# Patient Record
Sex: Female | Born: 1970 | Race: White | Hispanic: No | Marital: Single | Smoking: Former smoker
Health system: Southern US, Community
[De-identification: ages and names within clinical notes are randomized; demographics above are authoritative.]

## PROBLEM LIST (undated history)

## (undated) DIAGNOSIS — Z860100 Personal history of colon polyps, unspecified: Secondary | ICD-10-CM

## (undated) DIAGNOSIS — L409 Psoriasis, unspecified: Secondary | ICD-10-CM

## (undated) DIAGNOSIS — I82409 Acute embolism and thrombosis of unspecified deep veins of unspecified lower extremity: Secondary | ICD-10-CM

## (undated) DIAGNOSIS — F419 Anxiety disorder, unspecified: Secondary | ICD-10-CM

## (undated) DIAGNOSIS — F329 Major depressive disorder, single episode, unspecified: Secondary | ICD-10-CM

## (undated) DIAGNOSIS — R000 Tachycardia, unspecified: Secondary | ICD-10-CM

## (undated) DIAGNOSIS — F32A Depression, unspecified: Secondary | ICD-10-CM

## (undated) HISTORY — DX: Personal history of colonic polyps: Z86.010

## (undated) HISTORY — DX: Acute embolism and thrombosis of unspecified deep veins of unspecified lower extremity: I82.409

## (undated) HISTORY — DX: Anxiety disorder, unspecified: F41.9

## (undated) HISTORY — PX: COLPOSCOPY: SHX161

## (undated) HISTORY — DX: Personal history of colon polyps, unspecified: Z86.0100

## (undated) HISTORY — DX: Psoriasis, unspecified: L40.9

---

## 1998-05-17 ENCOUNTER — Encounter: Admission: RE | Admit: 1998-05-17 | Discharge: 1998-08-15 | Payer: Self-pay | Admitting: Gynecology

## 1998-11-21 ENCOUNTER — Inpatient Hospital Stay (HOSPITAL_COMMUNITY): Admission: AD | Admit: 1998-11-21 | Discharge: 1998-11-24 | Payer: Self-pay | Admitting: Obstetrics and Gynecology

## 1998-11-26 ENCOUNTER — Encounter (HOSPITAL_COMMUNITY): Admission: RE | Admit: 1998-11-26 | Discharge: 1999-02-24 | Payer: Self-pay | Admitting: *Deleted

## 1998-12-26 ENCOUNTER — Other Ambulatory Visit: Admission: RE | Admit: 1998-12-26 | Discharge: 1998-12-26 | Payer: Self-pay | Admitting: Obstetrics and Gynecology

## 2001-09-25 ENCOUNTER — Other Ambulatory Visit: Admission: RE | Admit: 2001-09-25 | Discharge: 2001-09-25 | Payer: Self-pay | Admitting: Gynecology

## 2002-10-13 ENCOUNTER — Other Ambulatory Visit: Admission: RE | Admit: 2002-10-13 | Discharge: 2002-10-13 | Payer: Self-pay | Admitting: Gynecology

## 2004-02-06 ENCOUNTER — Other Ambulatory Visit: Admission: RE | Admit: 2004-02-06 | Discharge: 2004-02-06 | Payer: Self-pay | Admitting: Gynecology

## 2005-02-13 ENCOUNTER — Other Ambulatory Visit: Admission: RE | Admit: 2005-02-13 | Discharge: 2005-02-13 | Payer: Self-pay | Admitting: Gynecology

## 2005-08-26 HISTORY — PX: TUBAL LIGATION: SHX77

## 2006-02-14 ENCOUNTER — Other Ambulatory Visit: Admission: RE | Admit: 2006-02-14 | Discharge: 2006-02-14 | Payer: Self-pay | Admitting: Gynecology

## 2006-08-26 HISTORY — PX: OTHER SURGICAL HISTORY: SHX169

## 2007-02-19 ENCOUNTER — Other Ambulatory Visit: Admission: RE | Admit: 2007-02-19 | Discharge: 2007-02-19 | Payer: Self-pay | Admitting: Gynecology

## 2008-11-24 ENCOUNTER — Other Ambulatory Visit: Admission: RE | Admit: 2008-11-24 | Discharge: 2008-11-24 | Payer: Self-pay | Admitting: Gynecology

## 2008-11-24 ENCOUNTER — Encounter: Payer: Self-pay | Admitting: Women's Health

## 2008-11-24 ENCOUNTER — Ambulatory Visit: Payer: Self-pay | Admitting: Women's Health

## 2009-07-05 ENCOUNTER — Ambulatory Visit: Payer: Self-pay | Admitting: Gynecology

## 2009-07-11 ENCOUNTER — Ambulatory Visit: Payer: Self-pay | Admitting: Gynecology

## 2009-08-26 HISTORY — PX: ANTERIOR CRUCIATE LIGAMENT REPAIR: SHX115

## 2010-04-05 ENCOUNTER — Ambulatory Visit: Payer: Self-pay | Admitting: Women's Health

## 2010-04-05 ENCOUNTER — Other Ambulatory Visit: Admission: RE | Admit: 2010-04-05 | Discharge: 2010-04-05 | Payer: Self-pay | Admitting: Gynecology

## 2010-07-26 ENCOUNTER — Ambulatory Visit: Payer: Self-pay | Admitting: Women's Health

## 2010-09-17 ENCOUNTER — Encounter: Payer: Self-pay | Admitting: Specialist

## 2011-04-13 ENCOUNTER — Other Ambulatory Visit: Payer: Self-pay | Admitting: Women's Health

## 2011-04-16 ENCOUNTER — Other Ambulatory Visit: Payer: Self-pay | Admitting: *Deleted

## 2011-04-16 NOTE — Telephone Encounter (Signed)
Refusal was called in for Alprazolam 0.5 #30 per [facility] [phone]

## 2011-05-27 ENCOUNTER — Other Ambulatory Visit: Payer: Self-pay | Admitting: Women's Health

## 2011-06-10 ENCOUNTER — Telehealth: Payer: Self-pay | Admitting: *Deleted

## 2011-06-10 DIAGNOSIS — F329 Major depressive disorder, single episode, unspecified: Secondary | ICD-10-CM

## 2011-06-10 DIAGNOSIS — F32A Depression, unspecified: Secondary | ICD-10-CM

## 2011-06-10 MED ORDER — FLUOXETINE HCL 20 MG PO CAPS: 20.0000 mg | ORAL_CAPSULE | Freq: Every day | ORAL | Status: DC

## 2011-06-10 NOTE — Telephone Encounter (Signed)
Left message on cell

## 2011-06-10 NOTE — Telephone Encounter (Signed)
Patient requests to speak to you about her meds.  No other information was given.

## 2011-06-10 NOTE — Telephone Encounter (Signed)
Telephone call to review med request. She states she stopped Zoloft several months ago due to cost, and is taking half dose of Wellbutrin. States would like to stop all medications but is still having problems with depression. 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 Both parents and brother are deceased, no family help available. States her daughters are well. Requested a medication that may be less expensive. States cannot afford a psychiatrist at this point, no insurance. Reviewed could try Prozac. Will finish out the remaining Wellbutrin and start on Prozac 20. Will call if problems with the change. Denies any suicidal ideation, but states is struggling with depression. Free Pap clinic discussed will call and make an appointment.

## 2011-07-21 ENCOUNTER — Other Ambulatory Visit: Payer: Self-pay | Admitting: Women's Health

## 2011-07-22 NOTE — Telephone Encounter (Signed)
RX CALLED IN SINCE E-SCRIPTS IS NOT WORKING TODAY.

## 2011-07-31 ENCOUNTER — Other Ambulatory Visit: Payer: Self-pay | Admitting: *Deleted

## 2011-07-31 NOTE — Telephone Encounter (Signed)
rx denied per last refill request. Needs annual exam

## 2011-08-02 ENCOUNTER — Telehealth: Payer: Self-pay | Admitting: *Deleted

## 2011-08-02 NOTE — Telephone Encounter (Signed)
Patient wants to speak directly to you.  She said she wants to talk to you about a medication she is on.  (FYI Prescriptions have been denied due to no annual exam -TF noted as well)

## 2011-08-05 NOTE — Telephone Encounter (Signed)
Telephone call, requesting medication for anxiety and depression. Has not been in the office in greater than one year did review we cannot prescribe until seen in the office. Switched to appointments.

## 2011-08-07 ENCOUNTER — Ambulatory Visit (INDEPENDENT_AMBULATORY_CARE_PROVIDER_SITE_OTHER): Payer: Self-pay | Admitting: Women's Health

## 2011-08-07 ENCOUNTER — Encounter: Payer: Self-pay | Admitting: Women's Health

## 2011-08-07 VITALS — BP 132/80

## 2011-08-07 DIAGNOSIS — F419 Anxiety disorder, unspecified: Secondary | ICD-10-CM

## 2011-08-07 DIAGNOSIS — F411 Generalized anxiety disorder: Secondary | ICD-10-CM

## 2011-08-07 NOTE — Progress Notes (Signed)
Patient ID: Sydney Glover, female   DOB: Sep 11, 1970, 40 y.o.   MRN: [id number] Presents for Prozac refill. Last annual was August of 2011. Reviewed Pap clinic, will have Pap done there and sent to our office. Having a regular 4 to five-day monthly cycle. No other complaints or concerns.  States doing well on Prozac 40 and would like to continue. She is without insurance, has had counseling in the past and declines need at this time. She has had situational problems in the past- divorce, bankruptcy, loss of job, and no child support. States has had long-term anxiety and depression and Prozac does help. Uses an occasional Xanax for rest.   Anxiety/depression   Plan: Prescription for Prozac 40 daily was given. Xanax 0.5 half to one tablet at bedtime when necessary. Reviewed importance of using sparingly, addictive properties reviewed. Encouraged counseling as needed. Reviewed importance of exercise in relationship to anxiety/depression.

## 2011-10-01 ENCOUNTER — Other Ambulatory Visit: Payer: Self-pay | Admitting: *Deleted

## 2011-10-02 MED ORDER — ALPRAZOLAM 0.5 MG PO TABS: 0.5000 mg | ORAL_TABLET | Freq: Every evening | ORAL | Status: DC | PRN

## 2011-10-02 NOTE — Telephone Encounter (Signed)
rx called in

## 2011-11-25 ENCOUNTER — Other Ambulatory Visit: Payer: Self-pay | Admitting: *Deleted

## 2011-11-25 MED ORDER — ALPRAZOLAM 0.5 MG PO TABS: 0.5000 mg | ORAL_TABLET | Freq: Every evening | ORAL | Status: DC | PRN

## 2011-11-25 NOTE — Telephone Encounter (Signed)
Pt saw you in Dec 2012

## 2011-12-23 ENCOUNTER — Other Ambulatory Visit: Payer: Self-pay | Admitting: *Deleted

## 2011-12-23 MED ORDER — ALPRAZOLAM 0.5 MG PO TABS: 0.5000 mg | ORAL_TABLET | Freq: Every evening | ORAL | Status: DC | PRN

## 2011-12-24 NOTE — Telephone Encounter (Signed)
rx called in

## 2012-01-24 ENCOUNTER — Other Ambulatory Visit: Payer: Self-pay | Admitting: *Deleted

## 2012-01-24 MED ORDER — ALPRAZOLAM 0.5 MG PO TABS: 0.5000 mg | ORAL_TABLET | Freq: Every evening | ORAL | Status: DC | PRN

## 2012-02-20 ENCOUNTER — Other Ambulatory Visit: Payer: Self-pay

## 2012-02-20 NOTE — Telephone Encounter (Signed)
Message left on cell to call in regards to prescription request

## 2012-02-21 MED ORDER — ALPRAZOLAM 0.5 MG PO TABS: 0.5000 mg | ORAL_TABLET | Freq: Every evening | ORAL | Status: DC | PRN

## 2012-02-21 NOTE — Telephone Encounter (Signed)
Message left on cell to cell in regards to prescription request.

## 2012-02-21 NOTE — Telephone Encounter (Signed)
Telephone call reviewed addictive properties of xanax, uses at hs for sleep mostly.  Aware of addictive properties will use sparingly.

## 2012-03-19 ENCOUNTER — Other Ambulatory Visit: Payer: Self-pay | Admitting: *Deleted

## 2012-03-19 MED ORDER — ALPRAZOLAM 0.5 MG PO TABS: 0.5000 mg | ORAL_TABLET | Freq: Every evening | ORAL | Status: DC | PRN

## 2012-03-19 NOTE — Telephone Encounter (Signed)
rx called in

## 2012-04-16 ENCOUNTER — Other Ambulatory Visit: Payer: Self-pay | Admitting: *Deleted

## 2012-04-16 NOTE — Telephone Encounter (Signed)
Last fill date 03-19-12

## 2012-04-17 ENCOUNTER — Other Ambulatory Visit: Payer: Self-pay | Admitting: Women's Health

## 2012-04-20 MED ORDER — ALPRAZOLAM 0.5 MG PO TABS: 0.5000 mg | ORAL_TABLET | Freq: Every evening | ORAL | Status: AC | PRN

## 2012-04-20 NOTE — Telephone Encounter (Signed)
Rx phoned into pharmacy.

## 2012-05-19 ENCOUNTER — Other Ambulatory Visit: Payer: Self-pay | Admitting: *Deleted

## 2012-05-20 MED ORDER — ALPRAZOLAM 0.5 MG PO TABS: 0.5000 mg | ORAL_TABLET | Freq: Every evening | ORAL | Status: DC | PRN

## 2012-06-20 ENCOUNTER — Other Ambulatory Visit: Payer: Self-pay | Admitting: Women's Health

## 2012-06-22 NOTE — Telephone Encounter (Signed)
Patient is overdue for yearly exam although she has seen you in the last year.  I will have appt desk attempt to contact her and get her CE scheduled.

## 2012-06-22 NOTE — Telephone Encounter (Signed)
Called into pharmacy

## 2012-08-09 ENCOUNTER — Other Ambulatory Visit: Payer: Self-pay | Admitting: Women's Health

## 2012-08-10 NOTE — Telephone Encounter (Signed)
Please call patient and inform it has been less than one month since last refill, review best not to take daily.

## 2012-08-10 NOTE — Telephone Encounter (Signed)
Called the pharmacy with denial Renette Butters

## 2012-08-14 ENCOUNTER — Other Ambulatory Visit: Payer: Self-pay | Admitting: Women's Health

## 2012-09-16 ENCOUNTER — Other Ambulatory Visit: Payer: Self-pay | Admitting: Women's Health

## 2012-09-17 NOTE — Telephone Encounter (Signed)
rx called in K[facility] 

## 2012-10-16 ENCOUNTER — Other Ambulatory Visit: Payer: Self-pay | Admitting: Women's Health

## 2012-10-16 NOTE — Telephone Encounter (Signed)
Pharmacy notified by phone of denial of refill due to patient needs to call the office and schedule appointment.

## 2012-10-17 ENCOUNTER — Other Ambulatory Visit: Payer: Self-pay | Admitting: Women's Health

## 2012-10-21 ENCOUNTER — Telehealth: Payer: Self-pay | Admitting: *Deleted

## 2012-10-21 DIAGNOSIS — F32A Depression, unspecified: Secondary | ICD-10-CM

## 2012-10-21 MED ORDER — FLUOXETINE HCL 40 MG PO CAPS: 40.0000 mg | ORAL_CAPSULE | Freq: Every day | ORAL | Status: DC

## 2012-10-21 NOTE — Telephone Encounter (Signed)
Pt Rx for Prozac 40 mg because pt is very overdue for annual. Last seen in 08/07/2011. I asked her about making appointment and she has no insurance. She asked if you would call her [phone] to speak with her.

## 2012-10-21 NOTE — Telephone Encounter (Signed)
Telephone call, instructed to make office visit. We will call in the Prozac for one month but cannot continue to call in. Switched to appointments.

## 2012-10-30 ENCOUNTER — Encounter: Payer: Self-pay | Admitting: Women's Health

## 2012-11-06 ENCOUNTER — Encounter: Payer: Self-pay | Admitting: Women's Health

## 2012-11-11 ENCOUNTER — Encounter: Payer: Self-pay | Admitting: Women's Health

## 2012-11-11 ENCOUNTER — Ambulatory Visit (INDEPENDENT_AMBULATORY_CARE_PROVIDER_SITE_OTHER): Payer: Self-pay | Admitting: Women's Health

## 2012-11-11 VITALS — BP 122/74 | Ht 64.0 in | Wt 242.0 lb

## 2012-11-11 DIAGNOSIS — Z01419 Encounter for gynecological examination (general) (routine) without abnormal findings: Secondary | ICD-10-CM

## 2012-11-11 DIAGNOSIS — F341 Dysthymic disorder: Secondary | ICD-10-CM

## 2012-11-11 DIAGNOSIS — Z860100 Personal history of colon polyps, unspecified: Secondary | ICD-10-CM | POA: Insufficient documentation

## 2012-11-11 DIAGNOSIS — F32A Depression, unspecified: Secondary | ICD-10-CM

## 2012-11-11 DIAGNOSIS — F329 Major depressive disorder, single episode, unspecified: Secondary | ICD-10-CM

## 2012-11-11 DIAGNOSIS — F419 Anxiety disorder, unspecified: Secondary | ICD-10-CM | POA: Insufficient documentation

## 2012-11-11 MED ORDER — FLUOXETINE HCL 20 MG PO CAPS: 40.0000 mg | ORAL_CAPSULE | Freq: Every day | ORAL | Status: DC

## 2012-11-11 MED ORDER — ALPRAZOLAM 0.5 MG PO TABS: 0.5000 mg | ORAL_TABLET | Freq: Every evening | ORAL | Status: DC | PRN

## 2012-11-11 NOTE — Progress Notes (Signed)
Sydney Glover May 27, 1971 [id number]    History:    The patient presents for annual exam.  Regular monthly cycle/condoms. History of normal Paps. Benign colon polyp 2011/father died of colon cancer at 48. Long-term history of anxiety with depression, has been on Lexapro, Zoloft, Wellbutrin in the past. Currently on Prozac 40 mg doing well other than complaint of increased fatigue. Uses occasional Xanax 0.5. Requesting minimum, in school and  working part time.   Past medical history, past surgical history, family history and social history were all reviewed and documented in the EPIC chart. Mother died of heart disease age 39. History of  BTL that was reversed 2007, but did not conceive after. Sydney Glover 20, hoping to go to [employer]. Sydney Glover 15 doing well, currently living with her father.   ROS:  A  ROS was performed and pertinent positives and negatives are included in the history.  Exam:  Filed Vitals:   11/11/12 1510  BP: 122/74    General appearance:  Normal Head/Neck:  Normal, without cervical or supraclavicular adenopathy. Thyroid:  Symmetrical, normal in size, without palpable masses or nodularity. Respiratory  Effort:  Normal  Auscultation:  Clear without wheezing or rhonchi Cardiovascular  Auscultation:  Regular rate, without rubs, murmurs or gallops  Edema/varicosities:  Not grossly evident Abdominal  Soft,nontender, without masses, guarding or rebound.  Liver/spleen:  No organomegaly noted  Hernia:  None appreciated  Skin  Inspection:  Grossly normal  Palpation:  Grossly normal Neurologic/psychiatric  Orientation:  Normal with appropriate conversation.  Mood/affect:  Normal  Genitourinary    Breasts: Examined lying and sitting.     Right: Without masses, retractions, discharge or axillary adenopathy.     Left: Without masses, retractions, discharge or axillary adenopathy.   Inguinal/mons:  Normal without inguinal adenopathy  External genitalia:   Normal  BUS/Urethra/Skene's glands:  Normal  Bladder:  Normal  Vagina:  Normal  Cervix:  Normal  Uterus:  normal in size, shape and contour.  Midline and mobile  Adnexa/parametria:     Rt: Without masses or tenderness.   Lt: Without masses or tenderness.  Anus and perineum: Normal  Digital rectal exam: Normal sphincter tone without palpated masses or tenderness  Assessment/Plan:  42 y.o. DWF G4P2  for annual exam.     Normal GYN exam Obesity Anxiety with depression  Plan: Discussion about importance of increasing regular exercise, decreasing calories for weight loss, increasing leisure activities. Currently on Prozac 40 mg with increased fatigue with good symptom relief, will try to decrease to 20 mg daily will taper down. Uses occasional Xanax 0.5, prescription, proper use, addictive properties reviewed. SBE's, annual mammogram at 40, calcium rich diet, vitamin D 1000 daily encouraged. Pap normal 03/2010, history of all normal Paps, will come to the Pap clinic for Pap. New Pap screening guidelines reviewed. Condoms with intercourse. Repeat colonoscopy next year.    Harrington Challenger [facility], 4:06 PM 11/11/2012'

## 2012-11-11 NOTE — Patient Instructions (Addendum)

## 2012-11-12 ENCOUNTER — Telehealth: Payer: Self-pay | Admitting: Women's Health

## 2012-11-12 ENCOUNTER — Encounter: Payer: Self-pay | Admitting: Gynecology

## 2012-11-12 NOTE — Telephone Encounter (Signed)
Left message on cell concerning Pap clinic dates: Monday, March 24 at [facility], or  April 14 in  [place], instructed to call Wynona Canes at the cancer center at [phone]

## 2013-03-12 ENCOUNTER — Other Ambulatory Visit: Payer: Self-pay | Admitting: Women's Health

## 2013-03-15 NOTE — Telephone Encounter (Signed)
Called into pharmacy

## 2013-06-24 ENCOUNTER — Other Ambulatory Visit: Payer: Self-pay | Admitting: Women's Health

## 2013-06-25 NOTE — Telephone Encounter (Signed)
Rx called in K[facility] 

## 2013-08-30 ENCOUNTER — Other Ambulatory Visit: Payer: Self-pay | Admitting: Women's Health

## 2013-08-31 NOTE — Telephone Encounter (Signed)
Rx called in 

## 2013-11-17 ENCOUNTER — Other Ambulatory Visit: Payer: Self-pay | Admitting: Women's Health

## 2013-11-22 ENCOUNTER — Other Ambulatory Visit: Payer: Self-pay

## 2013-11-22 MED ORDER — ALPRAZOLAM 0.5 MG PO TABS: ORAL_TABLET | ORAL | Status: DC

## 2013-11-22 NOTE — Telephone Encounter (Signed)
Last CE 11/11/12. Overdue.

## 2013-11-22 NOTE — Telephone Encounter (Signed)
Called into pharmacy

## 2013-12-20 ENCOUNTER — Other Ambulatory Visit: Payer: Self-pay

## 2013-12-20 DIAGNOSIS — F329 Major depressive disorder, single episode, unspecified: Secondary | ICD-10-CM

## 2013-12-20 DIAGNOSIS — F32A Depression, unspecified: Secondary | ICD-10-CM

## 2013-12-20 MED ORDER — FLUOXETINE HCL 20 MG PO CAPS: 40.0000 mg | ORAL_CAPSULE | Freq: Every day | ORAL | Status: DC

## 2014-01-13 ENCOUNTER — Emergency Department (HOSPITAL_BASED_OUTPATIENT_CLINIC_OR_DEPARTMENT_OTHER): Payer: Self-pay

## 2014-01-13 ENCOUNTER — Emergency Department (HOSPITAL_BASED_OUTPATIENT_CLINIC_OR_DEPARTMENT_OTHER)
Admission: EM | Admit: 2014-01-13 | Discharge: 2014-01-13 | Disposition: A | Discharge status: Home / Self Care | Payer: Self-pay | Attending: Emergency Medicine | Admitting: Emergency Medicine

## 2014-01-13 ENCOUNTER — Ambulatory Visit (HOSPITAL_BASED_OUTPATIENT_CLINIC_OR_DEPARTMENT_OTHER): Admission: RE | Admit: 2014-01-13 | Payer: Self-pay | Source: Ambulatory Visit

## 2014-01-13 ENCOUNTER — Encounter (HOSPITAL_BASED_OUTPATIENT_CLINIC_OR_DEPARTMENT_OTHER): Payer: Self-pay | Admitting: Emergency Medicine

## 2014-01-13 ENCOUNTER — Telehealth (HOSPITAL_BASED_OUTPATIENT_CLINIC_OR_DEPARTMENT_OTHER): Payer: Self-pay | Admitting: Emergency Medicine

## 2014-01-13 DIAGNOSIS — J189 Pneumonia, unspecified organism: Secondary | ICD-10-CM

## 2014-01-13 DIAGNOSIS — Z3202 Encounter for pregnancy test, result negative: Secondary | ICD-10-CM | POA: Insufficient documentation

## 2014-01-13 DIAGNOSIS — Z87891 Personal history of nicotine dependence: Secondary | ICD-10-CM | POA: Insufficient documentation

## 2014-01-13 DIAGNOSIS — L559 Sunburn, unspecified: Secondary | ICD-10-CM | POA: Insufficient documentation

## 2014-01-13 DIAGNOSIS — F411 Generalized anxiety disorder: Secondary | ICD-10-CM | POA: Insufficient documentation

## 2014-01-13 DIAGNOSIS — Z872 Personal history of diseases of the skin and subcutaneous tissue: Secondary | ICD-10-CM | POA: Insufficient documentation

## 2014-01-13 DIAGNOSIS — Z79899 Other long term (current) drug therapy: Secondary | ICD-10-CM | POA: Insufficient documentation

## 2014-01-13 DIAGNOSIS — Z860100 Personal history of colon polyps, unspecified: Secondary | ICD-10-CM | POA: Insufficient documentation

## 2014-01-13 DIAGNOSIS — J159 Unspecified bacterial pneumonia: Secondary | ICD-10-CM | POA: Insufficient documentation

## 2014-01-13 LAB — CBC WITH DIFFERENTIAL/PLATELET
Basophils Absolute: 0 10*3/uL (ref 0.0–0.1)
Basophils Relative: 0 % (ref 0–1)
Eosinophils Absolute: 0.1 10*3/uL (ref 0.0–0.7)
Eosinophils Relative: 1 % (ref 0–5)
HCT: 34 % — ABNORMAL LOW (ref 36.0–46.0)
Hemoglobin: 11 g/dL — ABNORMAL LOW (ref 12.0–15.0)
Lymphocytes Relative: 27 % (ref 12–46)
Lymphs Abs: 2 10*3/uL (ref 0.7–4.0)
MCH: 30.1 pg (ref 26.0–34.0)
MCHC: 32.4 g/dL (ref 30.0–36.0)
MCV: 93.2 fL (ref 78.0–100.0)
Monocytes Absolute: 0.3 10*3/uL (ref 0.1–1.0)
Monocytes Relative: 4 % (ref 3–12)
Neutro Abs: 5.1 10*3/uL (ref 1.7–7.7)
Neutrophils Relative %: 69 % (ref 43–77)
Platelets: 280 10*3/uL (ref 150–400)
RBC: 3.65 MIL/uL — ABNORMAL LOW (ref 3.87–5.11)
RDW: 13.6 % (ref 11.5–15.5)
WBC: 7.4 10*3/uL (ref 4.0–10.5)

## 2014-01-13 LAB — URINE MICROSCOPIC-ADD ON

## 2014-01-13 LAB — URINALYSIS, ROUTINE W REFLEX MICROSCOPIC
Bilirubin Urine: NEGATIVE
Glucose, UA: NEGATIVE mg/dL
Ketones, ur: NEGATIVE mg/dL
Leukocytes, UA: NEGATIVE
Nitrite: NEGATIVE
Protein, ur: NEGATIVE mg/dL
Specific Gravity, Urine: 1.012 (ref 1.005–1.030)
Urobilinogen, UA: 1 mg/dL (ref 0.0–1.0)
pH: 7 (ref 5.0–8.0)

## 2014-01-13 LAB — PREGNANCY, URINE: Preg Test, Ur: NEGATIVE

## 2014-01-13 LAB — BASIC METABOLIC PANEL
BUN: 12 mg/dL (ref 6–23)
CO2: 25 mEq/L (ref 19–32)
Calcium: 9.4 mg/dL (ref 8.4–10.5)
Chloride: 102 mEq/L (ref 96–112)
Creatinine, Ser: 0.8 mg/dL (ref 0.50–1.10)
GFR calc Af Amer: >90 mL/min (ref >90)
GFR calc non Af Amer: >90 mL/min (ref >90)
Glucose, Bld: 147 mg/dL — ABNORMAL HIGH (ref 70–99)
Potassium: 3.7 mEq/L (ref 3.7–5.3)
Sodium: 142 mEq/L (ref 137–147)

## 2014-01-13 LAB — TROPONIN I: Troponin I: <0.3 ng/mL (ref <0.30)

## 2014-01-13 LAB — PRO B NATRIURETIC PEPTIDE: Pro B Natriuretic peptide (BNP): 217.1 pg/mL — ABNORMAL HIGH (ref 0–125)

## 2014-01-13 LAB — CK: Total CK: 66 U/L (ref 7–177)

## 2014-01-13 MED ORDER — AZITHROMYCIN 250 MG PO TABS: 250.0000 mg | ORAL_TABLET | Freq: Every day | ORAL | Status: DC

## 2014-01-13 MED ORDER — HYDROCODONE-ACETAMINOPHEN 5-325 MG PO TABS: 1.0000 | ORAL_TABLET | ORAL | Status: DC | PRN

## 2014-01-13 MED ORDER — ALBUTEROL SULFATE HFA 108 (90 BASE) MCG/ACT IN AERS: 2.0000 | INHALATION_SPRAY | RESPIRATORY_TRACT | Status: DC | PRN

## 2014-01-13 MED ORDER — OXYCODONE-ACETAMINOPHEN 5-325 MG PO TABS
2.0000 | ORAL_TABLET | Freq: Once | ORAL | Status: AC
Start: 2014-01-13 — End: 2014-01-13
  Administered 2014-01-13: 2 via ORAL
  Filled 2014-01-13: qty 2

## 2014-01-13 MED ORDER — IBUPROFEN 800 MG PO TABS: 800.0000 mg | ORAL_TABLET | Freq: Three times a day (TID) | ORAL | Status: DC | PRN

## 2014-01-13 NOTE — ED Provider Notes (Signed)
TIME SEEN: 6:18 PM  CHIEF COMPLAINT: Bilateral lower extremity pain and swelling, shortness of breath  HPI: Patient is a 43 year old female with history of anxiety presents emergency department with complaints of lower extremity swelling that has been present take prednisone for back pain 2 weeks ago. She states that over the weekend, 4 days ago, she was sunburned. Since that time she has noticed increased swelling of bilateral lower extremities and pain. She has had some intermittent nausea and chills but this has improved. She denies that she is having chest pain or chest discomfort but has had 2 days of shortness of breath. She denies any fevers, vomiting or diarrhea. No history of cardiac disease, CHF, PE or DVT. No family history of premature CAD. She denies a history of hypertension, diabetes or hyperlipidemia. She was a former smoker.  ROS: See HPI Constitutional: no fever  Eyes: no drainage  ENT: no runny nose   Cardiovascular:  no chest pain  Resp:  SOB  GI: no vomiting GU: no dysuria Integumentary: no rash  Allergy: no hives  Musculoskeletal: no leg swelling  Neurological: no slurred speech ROS otherwise negative  PAST MEDICAL HISTORY/PAST SURGICAL HISTORY:  Past Medical History  Diagnosis Date  . Anxiety   . Psoriasis   . History of colonic polyps     BENIGN    MEDICATIONS:  Prior to Admission medications   Medication Sig Start Date End Date Taking? Authorizing Provider  ALPRAZolam Duanne Moron) 0.5 MG tablet TAKE 1 TABLET BY MOUTH AT BEDTIME AS NEEDED 11/22/13   Huel Cote, NP  FLUoxetine (PROZAC) 20 MG capsule Take 2 capsules (40 mg total) by mouth daily. 12/20/13   Huel Cote, NP    ALLERGIES:  No Known Allergies  SOCIAL HISTORY:  History  Substance Use Topics  . Smoking status: Former Research scientist (life sciences)  . Smokeless tobacco: Former Systems developer    Quit date: 11/12/2010  . Alcohol Use: Yes     Comment: not much    FAMILY HISTORY: Family History  Problem Relation Age of  Onset  . Hypertension Mother   . Cancer Father 54    COLON  . Diabetes Brother   . Heart attack Brother     1/2 brother-maternal    EXAM: BP 192/103  Pulse 94  Temp(Src) 98.2 F (36.8 C) (Oral)  Resp 16  Ht 5\' 4"  (1.626 m)  Wt 271 lb 12.8 oz (123.288 kg)  BMI 46.63 kg/m2  SpO2 100% CONSTITUTIONAL: Alert and oriented and responds appropriately to questions. Well-appearing; well-nourished, patient is in no apparent distress but appears anxious. She is morbidly obese HEAD: Normocephalic EYES: Conjunctivae clear, PERRL ENT: normal nose; no rhinorrhea; moist mucous membranes; pharynx without lesions noted NECK: Supple, no meningismus, no LAD  CARD: RRR; S1 and S2 appreciated; no murmurs, no clicks, no rubs, no gallops RESP: Normal chest excursion without splinting or tachypnea; breath sounds clear and equal bilaterally; no wheezes, no rhonchi, no rales, no hypoxia or respiratory distress ABD/GI: Normal bowel sounds; non-distended; soft, non-tender, no rebound, no guarding BACK:  The back appears normal and is non-tender to palpation, there is no CVA tenderness EXT: Normal ROM in all joints; non-tender to palpation; no edema; normal capillary refill; no cyanosis, 2+ radial and DP pulses bilaterally    SKIN: Normal color for age and race; warm, patient has a very mild first degree burn to bilateral shins, no blisters NEURO: Moves all extremities equally PSYCH: The patient's mood and manner are appropriate. Grooming and personal  hygiene are appropriate.  MEDICAL DECISION MAKING: Patient here with lower extremity swelling and pain after a sunburn and shortness of breath. Given her obesity, it is difficult for me to tell if there is any actual swelling of her extremities. She has no pitting edema. She has no signs of volume overload. No signs of cellulitis. She has no calf tenderness. She has good perfusion and equal pulses. I am not concerned this time that she has a DVT or arterial  obstruction. No sign of infection. We'll obtain basic labs, BMP and troponin as well as chest x-ray given her complaints of shortness of breath as well. Suspect her feelings of pain and swelling of her lower extremities are due to her first degree burn from her sunburn which is her improving. Given her swelling is bilateral, doubt DVT. She has no risk factors for PE or DVT other than obesity. She denies any chest pain. She is hemodynamically stable. Suspect her hypertension is secondary to anxiety and discomfort. Will treat her with pain medication and reassess. Anticipate that her workup today will likely be unremarkable and she can be discharged home with close outpatient followup.  ED PROGRESS: Patient's labs are unremarkable. Her BNP is 217. Her CK is normal. Her troponin is negative. I do not feel she needs serial sets of enzymes given she states she's had constant shortness of breath for several days. She states she has had a productive cough and some intermittent wheezing other her lungs are clear to auscultation currently. She has no hypoxia or respiratory distress. No leukocytosis or fever but given her complaints of chills, cough and wheezing and an x-ray that shows a possible right lower lobe pneumonia, will treat with azithromycin and albuterol. Have discussed with patient that I feel that her lower extreme swelling and pain is likely secondary to her sunburn is very mild on exam. Will discharge home with prescription for pain medicine but have discussed with patient that she should elevate her legs and use compression stockings. Patient verbalizes understanding and is comfortable with this plan.     EKG Interpretation  Date/Time:  Thursday Jan 13 2014 19:02:07 EDT Ventricular Rate:  96 PR Interval:  152 QRS Duration: 102 QT Interval:  356 QTC Calculation: 449 R Axis:   57 Text Interpretation:  Normal sinus rhythm Incomplete right bundle branch block Borderline ECG Confirmed by Jatoria Kneeland,  DO,  Jenilyn Magana ([id number]) on 01/13/2014 7:07:45 PM          [place], DO 01/13/14 2023

## 2014-01-13 NOTE — Discharge Instructions (Signed)
Pneumonia, Adult °Pneumonia is an infection of the lungs.  °CAUSES °Pneumonia may be caused by bacteria or a virus. Usually, these infections are caused by breathing infectious particles into the lungs (respiratory tract). °SYMPTOMS  °· Cough. °· Fever. °· Chest pain. °· Increased rate of breathing. °· Wheezing. °· Mucus production. °DIAGNOSIS  °If you have the common symptoms of pneumonia, your caregiver will typically confirm the diagnosis with a chest X-ray. The X-ray will show an abnormality in the lung (pulmonary infiltrate) if you have pneumonia. Other tests of your blood, urine, or sputum may be done to find the specific cause of your pneumonia. Your caregiver may also do tests (blood gases or pulse oximetry) to see how well your lungs are working. °TREATMENT  °Some forms of pneumonia may be spread to other people when you cough or sneeze. You may be asked to wear a mask before and during your exam. Pneumonia that is caused by bacteria is treated with antibiotic medicine. Pneumonia that is caused by the influenza virus may be treated with an antiviral medicine. Most other viral infections must run their course. These infections will not respond to antibiotics.  °PREVENTION °A pneumococcal shot (vaccine) is available to prevent a common bacterial cause of pneumonia. This is usually suggested for: °· People over 65 years old. °· Patients on chemotherapy. °· People with chronic lung problems, such as bronchitis or emphysema. °· People with immune system problems. °If you are over 65 or have a high risk condition, you may receive the pneumococcal vaccine if you have not received it before. In some countries, a routine influenza vaccine is also recommended. This vaccine can help prevent some cases of pneumonia. You may be offered the influenza vaccine as part of your care. °If you smoke, it is time to quit. You may receive instructions on how to stop smoking. Your caregiver can provide medicines and counseling to  help you quit. °HOME CARE INSTRUCTIONS  °· Cough suppressants may be used if you are losing too much rest. However, coughing protects you by clearing your lungs. You should avoid using cough suppressants if you can. °· Your caregiver may have prescribed medicine if he or she thinks your pneumonia is caused by a bacteria or influenza. Finish your medicine even if you start to feel better. °· Your caregiver may also prescribe an expectorant. This loosens the mucus to be coughed up. °· Only take over-the-counter or prescription medicines for pain, discomfort, or fever as directed by your caregiver. °· Do not smoke. Smoking is a common cause of bronchitis and can contribute to pneumonia. If you are a smoker and continue to smoke, your cough may last several weeks after your pneumonia has cleared. °· A cold steam vaporizer or humidifier in your room or home may help loosen mucus. °· Coughing is often worse at night. Sleeping in a semi-upright position in a recliner or using a couple pillows under your head will help with this. °· Get rest as you feel it is needed. Your body will usually let you know when you need to rest. °SEEK IMMEDIATE MEDICAL CARE IF:  °· Your illness becomes worse. This is especially true if you are elderly or weakened from any other disease. °· You cannot control your cough with suppressants and are losing sleep. °· You begin coughing up blood. °· You develop pain which is getting worse or is uncontrolled with medicines. °· You have a fever. °· Any of the symptoms which initially brought you in for treatment   are getting worse rather than better.  You develop shortness of breath or chest pain. MAKE SURE YOU:   Understand these instructions.  Will watch your condition.  Will get help right away if you are not doing well or get worse. Document Released: 08/12/2005 Document Revised: 11/04/2011 Document Reviewed: 11/01/2010 [facility] Patient Information 2014 [place], [place]. Sunburn Sunburn  is damage to the skin caused by overexposure to ultraviolet (UV) rays. People with light skin or a fair complexion may be more susceptible to sunburn. Repeated sun exposure causes early skin aging such as wrinkles and sun spots. It also increases the risk of skin cancer. CAUSES A sunburn is caused by getting too much UV radiation from the sun. SYMPTOMS  Red or pink skin.  Soreness and swelling.  Pain.  Blisters.  Peeling skin.  Headache, fever, and fatigue if sunburn covers a large area. TREATMENT  Your caregiver may tell you to take certain medicines to lessen inflammation.  Your caregiver may have you use hydrocortisone cream or spray to help with itching and inflammation.  Your caregiver may prescribe an antibiotic cream to use on blisters. HOME CARE INSTRUCTIONS   Avoid further exposure to the sun.  Cool baths and cool compresses may be helpful if used several times per day. Do not apply ice, since this may result in more damage to the skin.  Only take over-the-counter or prescription medicines for pain, discomfort, or fever as directed by your caregiver.  Use aloe or other over-the-counter sunburn creams or gels on your skin. Do not apply these creams or gels on blisters.  Drink enough fluids to keep your urine clear or pale yellow.  Do not break blisters. If blisters break, your caregiver may recommend an antibiotic cream to apply to the affected area. PREVENTION   Try to avoid the sun between 10:00 a.m. and 4:00 p.m. when it is the strongest.  Apply sunscreen at least 30 minutes before exposure to the sun.  Always wear protective hats, clothing, and sunglasses with UV protection.  Avoid medicines, herbs, and foods that increase your sensitivity to sunlight.  Avoid tanning beds. SEEK IMMEDIATE MEDICAL CARE IF:   You have a fever.  Your pain is uncontrolled with medicine.  You start to vomit or have diarrhea.  You feel faint or develop a headache with  confusion.  You develop severe blistering.  You have a pus-like (purulent) discharge coming from the blisters.  Your burn becomes more painful and swollen. MAKE SURE YOU:  Understand these instructions.  Will watch your condition.  Will get help right away if you are not doing well or get worse. Document Released: 05/22/2005 Document Revised: 12/07/2012 Document Reviewed: 02/03/2011 [facility] Patient Information 2014 [place], [place].    Please keep your leg elevated at all times and you may use compression stockings to help with your swelling.

## 2014-01-13 NOTE — ED Notes (Signed)
Swelling in her legs and abdominal bloating x 2 weeks. Worse after getting sunburn on Sunday.

## 2014-02-09 ENCOUNTER — Encounter: Payer: Self-pay | Admitting: Women's Health

## 2014-02-14 ENCOUNTER — Telehealth: Payer: Self-pay | Admitting: *Deleted

## 2014-02-14 DIAGNOSIS — F32A Depression, unspecified: Secondary | ICD-10-CM

## 2014-02-14 DIAGNOSIS — F329 Major depressive disorder, single episode, unspecified: Secondary | ICD-10-CM

## 2014-02-14 MED ORDER — FLUOXETINE HCL 20 MG PO CAPS: 40.0000 mg | ORAL_CAPSULE | Freq: Every day | ORAL | Status: DC

## 2014-02-14 NOTE — Telephone Encounter (Signed)
Yes okay for xanax .5 every 8 hrs prn   #30 with no refills

## 2014-02-14 NOTE — Telephone Encounter (Signed)
Pt called requesting refill on Prozac 20 mg and xanax 0.5 mg. I explained to patient nancy out of the office until next Tuesday (02/22/14) I can send Prozac 20 mg and nancy will have to approve xanax 0.5 mg.  Pt has annual scheduled on 03/03/14 pt just got health insurance. Okay to refill xanax 0.5 mg?

## 2014-02-15 MED ORDER — ALPRAZOLAM 0.5 MG PO TABS: ORAL_TABLET | ORAL | Status: DC

## 2014-02-15 NOTE — Telephone Encounter (Signed)
rx called in, left on pt voicemail this has been done.

## 2014-03-03 ENCOUNTER — Encounter: Payer: Self-pay | Admitting: Women's Health

## 2014-03-03 ENCOUNTER — Ambulatory Visit (INDEPENDENT_AMBULATORY_CARE_PROVIDER_SITE_OTHER): Payer: Managed Care, Other (non HMO) | Admitting: Women's Health

## 2014-03-03 VITALS — BP 128/86 | Ht 64.0 in | Wt 272.8 lb

## 2014-03-03 DIAGNOSIS — Z833 Family history of diabetes mellitus: Secondary | ICD-10-CM

## 2014-03-03 DIAGNOSIS — Z113 Encounter for screening for infections with a predominantly sexual mode of transmission: Secondary | ICD-10-CM

## 2014-03-03 DIAGNOSIS — N760 Acute vaginitis: Secondary | ICD-10-CM

## 2014-03-03 DIAGNOSIS — A499 Bacterial infection, unspecified: Secondary | ICD-10-CM

## 2014-03-03 DIAGNOSIS — F329 Major depressive disorder, single episode, unspecified: Secondary | ICD-10-CM

## 2014-03-03 DIAGNOSIS — F3289 Other specified depressive episodes: Secondary | ICD-10-CM

## 2014-03-03 DIAGNOSIS — F32A Depression, unspecified: Secondary | ICD-10-CM

## 2014-03-03 DIAGNOSIS — B9689 Other specified bacterial agents as the cause of diseases classified elsewhere: Secondary | ICD-10-CM

## 2014-03-03 DIAGNOSIS — Z01419 Encounter for gynecological examination (general) (routine) without abnormal findings: Secondary | ICD-10-CM

## 2014-03-03 LAB — CBC WITH DIFFERENTIAL/PLATELET
Basophils Absolute: 0 10*3/uL (ref 0.0–0.1)
Basophils Relative: 0 % (ref 0–1)
Eosinophils Absolute: 0.1 10*3/uL (ref 0.0–0.7)
Eosinophils Relative: 1 % (ref 0–5)
HCT: 34.7 % — ABNORMAL LOW (ref 36.0–46.0)
Hemoglobin: 11.5 g/dL — ABNORMAL LOW (ref 12.0–15.0)
Lymphocytes Relative: 30 % (ref 12–46)
Lymphs Abs: 1.8 10*3/uL (ref 0.7–4.0)
MCH: 28.9 pg (ref 26.0–34.0)
MCHC: 33.1 g/dL (ref 30.0–36.0)
MCV: 87.2 fL (ref 78.0–100.0)
Monocytes Absolute: 0.2 10*3/uL (ref 0.1–1.0)
Monocytes Relative: 3 % (ref 3–12)
Neutro Abs: 4 10*3/uL (ref 1.7–7.7)
Neutrophils Relative %: 66 % (ref 43–77)
Platelets: 278 10*3/uL (ref 150–400)
RBC: 3.98 MIL/uL (ref 3.87–5.11)
RDW: 14.6 % (ref 11.5–15.5)
WBC: 6 10*3/uL (ref 4.0–10.5)

## 2014-03-03 LAB — HEMOGLOBIN A1C
Hgb A1c MFr Bld: 5.2 % (ref <5.7)
Mean Plasma Glucose: 103 mg/dL (ref <117)

## 2014-03-03 LAB — WET PREP FOR TRICH, YEAST, CLUE
Trich, Wet Prep: NONE SEEN
Yeast Wet Prep HPF POC: NONE SEEN

## 2014-03-03 MED ORDER — METRONIDAZOLE 500 MG PO TABS: 500.0000 mg | ORAL_TABLET | Freq: Two times a day (BID) | ORAL | Status: DC

## 2014-03-03 MED ORDER — FLUOXETINE HCL 20 MG PO CAPS: 40.0000 mg | ORAL_CAPSULE | Freq: Every day | ORAL | Status: DC

## 2014-03-03 NOTE — Patient Instructions (Signed)

## 2014-03-03 NOTE — Progress Notes (Signed)
Sydney Glover 1970/09/15 [id number]    History:    Presents for annual exam.  Couplet days heavy using no contraception. History of a BTL with reversal 2007 but was unable to conceive after. Normal Pap history has not had any mammogram. History of anxiety and depression has been on Wellbutrin, Lexapro, Zoloft in the past currently on Prozac 40 mg daily and doing well. 2011 benign colon polyp, father history of colon cancer. Had severe sunburn and pneumonia  01/13/2014,  blood sugar 147.  Past medical history, past surgical history, family history and social history were all reviewed and documented in the EPIC chart. Working in Insurance underwriter in office. Mother died age 64 from heart related problems, father died of colon cancer. [place] 22 making poor choices, Sydney Glover 16 doing well.  ROS:  A  12 point ROS was performed and pertinent positives and negatives are included.  Exam:  Filed Vitals:   03/03/14 0832  BP: 128/86    General appearance:  Normal Thyroid:  Symmetrical, normal in size, without palpable masses or nodularity. Respiratory  Auscultation:  Clear without wheezing or rhonchi Cardiovascular  Auscultation:  Regular rate, without rubs, murmurs or gallops  Edema/varicosities:  Not grossly evident Abdominal  Soft,nontender, without masses, guarding or rebound.  Liver/spleen:  No organomegaly noted  Hernia:  None appreciated  Skin  Inspection:  Grossly normal   Breasts: Examined lying and sitting.     Right: Without masses, retractions, discharge or axillary adenopathy.     Left: Without masses, retractions, discharge or axillary adenopathy. Gentitourinary   Inguinal/mons:  Normal without inguinal adenopathy  External genitalia:  Normal  BUS/Urethra/Skene's glands:  Normal  Vagina:  Normal  Cervix:  Normal  Uterus:   normal in size, shape and contour.  Midline and mobile  Adnexa/parametria:     Rt: Without masses or tenderness.   Lt: Without masses or tenderness.  Anus  and perineum: Normal  Digital rectal exam: Normal sphincter tone without palpated masses or tenderness  Assessment/Plan:  43 y.o.DWF G3P2  for annual exam with no complaints.    Morbid obesity  Glucose- 147   12/2013 Anxiety/depression stable on Prozac 40 2011 benign colon polyp Monthly cycle/BTL reversal 2007 with infertility STD screen BV  Plan: Prozac 40 mg prescription, proper use given and reviewed, denies need for counseling at this time. Reviewed importance of followup colonoscopy, instructed to schedule. Condoms encouraged until permanent partner. SBE's, schedule screening annual mammogram. Long discussion on importance of weight loss for health, decreasing calories, increasing exercise. CBC, hemoglobin A1c, UA, HIV, hep B, C., RPR, GC/Chlamydia, half-normal 2014, new screening guidelines reviewed. Flagyl 500 twice daily for 7 days #14, alcohol precautions reviewed. Instructed to call if no relief of discharge. Instructed to check blood pressure away from office, diastolic 86 today, reviewed if continues greater than 130/80 followup with primary care for possible medication.   Note: This dictation was prepared with Dragon/digital dictation.  Any transcriptional errors that result are unintentional. Sydney Glover [facility], 9:03 AM 41/04/2014

## 2014-03-04 LAB — URINALYSIS W MICROSCOPIC + REFLEX CULTURE
Bilirubin Urine: NEGATIVE
Casts: NONE SEEN
Crystals: NONE SEEN
Glucose, UA: NEGATIVE mg/dL
Hgb urine dipstick: NEGATIVE
Ketones, ur: NEGATIVE mg/dL
Leukocytes, UA: NEGATIVE
Nitrite: NEGATIVE
Protein, ur: NEGATIVE mg/dL
Specific Gravity, Urine: 1.02 (ref 1.005–1.030)
Urobilinogen, UA: 0.2 mg/dL (ref 0.0–1.0)
pH: 7 (ref 5.0–8.0)

## 2014-03-04 LAB — HEPATITIS B SURFACE ANTIGEN: Hepatitis B Surface Ag: NEGATIVE

## 2014-03-04 LAB — HIV ANTIBODY (ROUTINE TESTING W REFLEX): HIV 1&2 Ab, 4th Generation: NONREACTIVE

## 2014-03-04 LAB — GC/CHLAMYDIA PROBE AMP
CT Probe RNA: NEGATIVE
GC Probe RNA: NEGATIVE

## 2014-03-04 LAB — HEPATITIS C ANTIBODY: HCV Ab: NEGATIVE

## 2014-03-04 LAB — RPR

## 2014-03-05 LAB — URINE CULTURE
Colony Count: NO GROWTH
Organism ID, Bacteria: NO GROWTH

## 2014-03-07 ENCOUNTER — Encounter: Payer: Self-pay | Admitting: Women's Health

## 2014-03-14 ENCOUNTER — Telehealth: Payer: Self-pay

## 2014-03-14 ENCOUNTER — Other Ambulatory Visit: Payer: Self-pay

## 2014-03-14 ENCOUNTER — Other Ambulatory Visit: Payer: Self-pay | Admitting: Women's Health

## 2014-03-14 MED ORDER — ALPRAZOLAM 0.5 MG PO TABS: ORAL_TABLET | ORAL | Status: DC

## 2014-03-14 MED ORDER — IBUPROFEN 800 MG PO TABS: 800.0000 mg | ORAL_TABLET | Freq: Three times a day (TID) | ORAL | Status: DC | PRN

## 2014-03-14 NOTE — Telephone Encounter (Signed)
Ok to refill 

## 2014-03-14 NOTE — Telephone Encounter (Signed)
Rx sent 

## 2014-03-14 NOTE — Telephone Encounter (Signed)
Please call and review should not take daily, addictive, use sparingly, ok to refill,

## 2014-03-14 NOTE — Telephone Encounter (Signed)
Called into pharmacy

## 2014-03-14 NOTE — Telephone Encounter (Signed)
Pharmacy faxed note requesting new Rx for Ibuprofen 800mg  tab #30 S: one tab q 8 h prn mild pain.  Previously prescribed for her by [facility], DO.

## 2014-04-25 ENCOUNTER — Other Ambulatory Visit: Payer: Self-pay

## 2014-04-26 ENCOUNTER — Other Ambulatory Visit: Payer: Self-pay | Admitting: Women's Health

## 2014-04-26 MED ORDER — ALPRAZOLAM 0.5 MG PO TABS: ORAL_TABLET | ORAL | Status: DC

## 2014-04-26 NOTE — Telephone Encounter (Signed)
Left detailed message for patient on cell phone voice mail relaying [place] message below.

## 2014-04-26 NOTE — Telephone Encounter (Signed)
Ok to refill, please call pt and review addictive prop and to not use daily.

## 2014-05-27 ENCOUNTER — Other Ambulatory Visit: Payer: Self-pay

## 2014-05-27 MED ORDER — IBUPROFEN 800 MG PO TABS: 800.0000 mg | ORAL_TABLET | Freq: Three times a day (TID) | ORAL | Status: DC | PRN

## 2014-06-27 ENCOUNTER — Encounter: Payer: Self-pay | Admitting: Women's Health

## 2014-06-27 ENCOUNTER — Other Ambulatory Visit: Payer: Self-pay

## 2014-06-27 MED ORDER — ALPRAZOLAM 0.5 MG PO TABS: ORAL_TABLET | ORAL | Status: DC

## 2014-06-27 NOTE — Telephone Encounter (Signed)
Called into pharmacy

## 2014-07-04 ENCOUNTER — Other Ambulatory Visit: Payer: Self-pay | Admitting: Dermatology

## 2014-08-23 ENCOUNTER — Other Ambulatory Visit: Payer: Self-pay

## 2014-08-23 MED ORDER — ALPRAZOLAM 0.5 MG PO TABS: ORAL_TABLET | ORAL | Status: DC

## 2014-08-24 NOTE — Telephone Encounter (Signed)
Called into pharmacy

## 2014-10-20 ENCOUNTER — Other Ambulatory Visit: Payer: Self-pay

## 2014-10-20 MED ORDER — IBUPROFEN 800 MG PO TABS: 800.0000 mg | ORAL_TABLET | Freq: Three times a day (TID) | ORAL | Status: DC | PRN

## 2014-11-03 ENCOUNTER — Other Ambulatory Visit: Payer: Self-pay

## 2014-11-03 MED ORDER — ALPRAZOLAM 0.5 MG PO TABS
ORAL_TABLET | ORAL | Status: DC
Start: 2014-11-03 — End: 2015-01-06

## 2014-11-03 NOTE — Telephone Encounter (Signed)
Called into pharmacy

## 2015-01-06 ENCOUNTER — Other Ambulatory Visit: Payer: Self-pay

## 2015-01-06 NOTE — Telephone Encounter (Signed)
This is a patient of Elon Alas, NP. She is out of office x 2 weeks.   " Huel Cote, NP at 11/11/2012 4:06 PM     Status: Signed       Expand All Collapse All   Sydney Ralph Granberg28-Oct-[id number]    History: The patient presents for annual exam. Regular monthly cycle/condoms. History of normal Paps. Benign colon polyp 2011/father died of colon cancer at 76. Long-term history of anxiety with depression, has been on Lexapro, Zoloft, Wellbutrin in the past. Currently on Prozac 40 mg doing well other than complaint of increased fatigue. Uses occasional Xanax 0.5. Requesting minimum, in school and working part time."

## 2015-01-09 MED ORDER — IBUPROFEN 800 MG PO TABS: 800.0000 mg | ORAL_TABLET | Freq: Three times a day (TID) | ORAL | Status: DC | PRN

## 2015-01-09 MED ORDER — ALPRAZOLAM 0.5 MG PO TABS: ORAL_TABLET | ORAL | Status: DC

## 2015-01-09 NOTE — Telephone Encounter (Signed)
Rx called in 

## 2015-02-09 ENCOUNTER — Other Ambulatory Visit: Payer: Self-pay | Admitting: Gynecology

## 2015-02-10 NOTE — Telephone Encounter (Signed)
Last filled on 01/09/15

## 2015-02-10 NOTE — Telephone Encounter (Signed)
Rx called in 

## 2015-03-14 ENCOUNTER — Inpatient Hospital Stay (HOSPITAL_COMMUNITY)
Admission: AD | Admit: 2015-03-14 | Discharge: 2015-03-17 | DRG: 882 | Disposition: A | Discharge status: Home / Self Care | Payer: Medicaid Other | Source: Intra-hospital | Attending: Psychiatry | Admitting: Psychiatry

## 2015-03-14 ENCOUNTER — Emergency Department (HOSPITAL_COMMUNITY)
Admission: EM | Admit: 2015-03-14 | Discharge: 2015-03-14 | Disposition: A | Discharge status: Other Acute Inpatient Hospital | Payer: Medicaid Other | Attending: Emergency Medicine | Admitting: Emergency Medicine

## 2015-03-14 ENCOUNTER — Encounter (HOSPITAL_COMMUNITY): Payer: Self-pay | Admitting: *Deleted

## 2015-03-14 DIAGNOSIS — F332 Major depressive disorder, recurrent severe without psychotic features: Secondary | ICD-10-CM | POA: Diagnosis not present

## 2015-03-14 DIAGNOSIS — F329 Major depressive disorder, single episode, unspecified: Secondary | ICD-10-CM | POA: Insufficient documentation

## 2015-03-14 DIAGNOSIS — F4001 Agoraphobia with panic disorder: Principal | ICD-10-CM | POA: Diagnosis present

## 2015-03-14 DIAGNOSIS — Z872 Personal history of diseases of the skin and subcutaneous tissue: Secondary | ICD-10-CM | POA: Diagnosis not present

## 2015-03-14 DIAGNOSIS — Z87891 Personal history of nicotine dependence: Secondary | ICD-10-CM | POA: Diagnosis not present

## 2015-03-14 DIAGNOSIS — F419 Anxiety disorder, unspecified: Secondary | ICD-10-CM | POA: Insufficient documentation

## 2015-03-14 DIAGNOSIS — Z86010 Personal history of colonic polyps: Secondary | ICD-10-CM | POA: Diagnosis not present

## 2015-03-14 DIAGNOSIS — Z79899 Other long term (current) drug therapy: Secondary | ICD-10-CM | POA: Insufficient documentation

## 2015-03-14 DIAGNOSIS — F32A Depression, unspecified: Secondary | ICD-10-CM

## 2015-03-14 HISTORY — DX: Major depressive disorder, single episode, unspecified: F32.9

## 2015-03-14 HISTORY — DX: Depression, unspecified: F32.A

## 2015-03-14 LAB — BASIC METABOLIC PANEL
Anion gap: 9 (ref 5–15)
BUN: 14 mg/dL (ref 6–20)
CO2: 24 mmol/L (ref 22–32)
Calcium: 9.8 mg/dL (ref 8.9–10.3)
Chloride: 106 mmol/L (ref 101–111)
Creatinine, Ser: 0.72 mg/dL (ref 0.44–1.00)
GFR calc Af Amer: >60 mL/min (ref >60)
GFR calc non Af Amer: >60 mL/min (ref >60)
Glucose, Bld: 105 mg/dL — ABNORMAL HIGH (ref 65–99)
Potassium: 4.5 mmol/L (ref 3.5–5.1)
Sodium: 139 mmol/L (ref 135–145)

## 2015-03-14 LAB — CBC WITH DIFFERENTIAL/PLATELET
Basophils Absolute: 0 10*3/uL (ref 0.0–0.1)
Basophils Relative: 0 % (ref 0–1)
Eosinophils Absolute: 0 10*3/uL (ref 0.0–0.7)
Eosinophils Relative: 0 % (ref 0–5)
HCT: 41.9 % (ref 36.0–46.0)
Hemoglobin: 13.1 g/dL (ref 12.0–15.0)
Lymphocytes Relative: 17 % (ref 12–46)
Lymphs Abs: 1.8 10*3/uL (ref 0.7–4.0)
MCH: 29 pg (ref 26.0–34.0)
MCHC: 31.3 g/dL (ref 30.0–36.0)
MCV: 92.9 fL (ref 78.0–100.0)
Monocytes Absolute: 0.4 10*3/uL (ref 0.1–1.0)
Monocytes Relative: 4 % (ref 3–12)
Neutro Abs: 8.3 10*3/uL — ABNORMAL HIGH (ref 1.7–7.7)
Neutrophils Relative %: 79 % — ABNORMAL HIGH (ref 43–77)
Platelets: 317 10*3/uL (ref 150–400)
RBC: 4.51 MIL/uL (ref 3.87–5.11)
RDW: 13.7 % (ref 11.5–15.5)
WBC: 10.6 10*3/uL — ABNORMAL HIGH (ref 4.0–10.5)

## 2015-03-14 LAB — RAPID URINE DRUG SCREEN, HOSP PERFORMED
Amphetamines: NOT DETECTED
Barbiturates: NOT DETECTED
Benzodiazepines: NOT DETECTED
Cocaine: NOT DETECTED
Opiates: NOT DETECTED
Tetrahydrocannabinol: NOT DETECTED

## 2015-03-14 LAB — ETHANOL: Alcohol, Ethyl (B): <5 mg/dL (ref <5)

## 2015-03-14 MED ORDER — ONDANSETRON HCL 4 MG PO TABS: 4.0000 mg | ORAL_TABLET | Freq: Three times a day (TID) | ORAL | Status: DC | PRN

## 2015-03-14 MED ORDER — ALUM & MAG HYDROXIDE-SIMETH 200-200-20 MG/5ML PO SUSP: 30.0000 mL | ORAL | Status: DC | PRN

## 2015-03-14 MED ORDER — MAGNESIUM HYDROXIDE 400 MG/5ML PO SUSP: 30.0000 mL | Freq: Every day | ORAL | Status: DC | PRN

## 2015-03-14 MED ORDER — LORAZEPAM 1 MG PO TABS: 1.0000 mg | ORAL_TABLET | Freq: Three times a day (TID) | ORAL | Status: DC | PRN

## 2015-03-14 MED ORDER — HYDROXYZINE HCL 25 MG PO TABS
25.0000 mg | ORAL_TABLET | Freq: Four times a day (QID) | ORAL | Status: DC | PRN
  Administered 2015-03-14 – 2015-03-17 (×5): 25 mg via ORAL
  Filled 2015-03-14 (×6): qty 1

## 2015-03-14 MED ORDER — IBUPROFEN 200 MG PO TABS
600.0000 mg | ORAL_TABLET | Freq: Three times a day (TID) | ORAL | Status: DC | PRN
  Administered 2015-03-14: 600 mg via ORAL
  Filled 2015-03-14: qty 3

## 2015-03-14 MED ORDER — ACETAMINOPHEN 325 MG PO TABS
650.0000 mg | ORAL_TABLET | Freq: Four times a day (QID) | ORAL | Status: DC | PRN
  Administered 2015-03-14 – 2015-03-17 (×7): 650 mg via ORAL
  Filled 2015-03-14 (×6): qty 2

## 2015-03-14 MED ORDER — TRAZODONE HCL 50 MG PO TABS
50.0000 mg | ORAL_TABLET | Freq: Every evening | ORAL | Status: DC | PRN
  Administered 2015-03-14 – 2015-03-16 (×3): 50 mg via ORAL
  Filled 2015-03-14 (×2): qty 1
  Filled 2015-03-14: qty 14
  Filled 2015-03-14: qty 1

## 2015-03-14 MED ORDER — ACETAMINOPHEN 325 MG PO TABS: 650.0000 mg | ORAL_TABLET | ORAL | Status: DC | PRN

## 2015-03-14 NOTE — Tx Team (Signed)
Initial Interdisciplinary Treatment Plan   PATIENT STRESSORS: Marital or family conflict Substance abuse   PATIENT STRENGTHS: Ability for insight Active sense of humor Average or above average intelligence Capable of independent living Occupational psychologist fund of knowledge Motivation for treatment/growth Supportive family/friends Work skills   PROBLEM LIST: Problem List/Patient Goals Date to be addressed Date deferred Reason deferred Estimated date of resolution  "alcohol" 03/14/2015   D/c  "depression" 03/14/2015   D/c  "anxiety" 03/14/2015   D/c                                       DISCHARGE CRITERIA:  Ability to meet basic life and health needs Adequate post-discharge living arrangements Improved stabilization in mood, thinking, and/or behavior Medical problems require only outpatient monitoring Motivation to continue treatment in a less acute level of care Need for constant or close observation no longer present Reduction of life-threatening or endangering symptoms to within safe limits Safe-care adequate arrangements made Verbal commitment to aftercare and medication compliance Withdrawal symptoms are absent or subacute and managed without 24-hour nursing intervention  PRELIMINARY DISCHARGE PLAN: Attend aftercare/continuing care group Attend PHP/IOP Attend 12-step recovery group Outpatient therapy Participate in family therapy Return to previous living arrangement Return to previous work or school arrangements  PATIENT/FAMIILY INVOLVEMENT: This treatment plan has been presented to and reviewed with the patient, Sydney Glover.  The patient and family have been given the opportunity to ask questions and make suggestions.  [name] 03/14/2015, 7:10 PM

## 2015-03-14 NOTE — ED Notes (Signed)
Pt brought in by sheriff, sent by [facility] for suicidal thoughts and depression. Pt denies having plan, states "I couldn't do it". Pt states she feels overwhelmed by taking care of her ex-mother in law and losing her job next month. Pt is on fluoxetine and xanax, states she has not taken it for the past week. Pt states she began feeling overwhelmed last month when her mother in law had to be hospitalized.

## 2015-03-14 NOTE — ED Notes (Signed)
[facility] called for transport. 
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
## 2015-03-14 NOTE — Progress Notes (Signed)
Patient ID: Sydney Glover, female   DOB: 06/08/1971, 44 y.o.   MRN: [id number] D: Client in room tonight teary, during BF visit. Client endorses "stressors" but does not elaborate, reporting a headache "8" of 10 since crying since earlier tonight. A: Writer provided emotional support encouraged client to report any concerns. Administered Acetaminophen 650 mg po. Staff will monitor q87min for safety. R:Client is safe on the unit, did not attended group.

## 2015-03-14 NOTE — Progress Notes (Signed)
Adult Psychoeducational Group Note  Date:  03/14/2015 Time:  9:01 PM  Group Topic/Focus:  Wrap-Up Group:   The focus of this group is to help patients review their daily goal of treatment and discuss progress on daily workbooks.  Participation Level:  Did not attend.  Modes of Intervention:  Activity  Additional Comments:  Pts played a therapeutic activity of [place]. Pt was invited however did not attend and stayed in her room.  Clint Bolder 03/14/2015, 9:01 PM

## 2015-03-15 ENCOUNTER — Encounter (HOSPITAL_COMMUNITY): Payer: Self-pay | Admitting: Psychiatry

## 2015-03-15 DIAGNOSIS — F4001 Agoraphobia with panic disorder: Principal | ICD-10-CM

## 2015-03-15 DIAGNOSIS — F332 Major depressive disorder, recurrent severe without psychotic features: Secondary | ICD-10-CM

## 2015-03-15 MED ORDER — IBUPROFEN 600 MG PO TABS
600.0000 mg | ORAL_TABLET | Freq: Four times a day (QID) | ORAL | Status: DC | PRN
  Administered 2015-03-15 – 2015-03-17 (×4): 600 mg via ORAL
  Filled 2015-03-15 (×4): qty 1

## 2015-03-15 MED ORDER — CITALOPRAM HYDROBROMIDE 20 MG PO TABS
20.0000 mg | ORAL_TABLET | Freq: Every day | ORAL | Status: DC
  Administered 2015-03-15 – 2015-03-17 (×3): 20 mg via ORAL
  Filled 2015-03-15 (×4): qty 1
  Filled 2015-03-15: qty 14
  Filled 2015-03-15: qty 1

## 2015-03-15 MED ORDER — LORAZEPAM 0.5 MG PO TABS
0.5000 mg | ORAL_TABLET | Freq: Four times a day (QID) | ORAL | Status: DC | PRN
  Administered 2015-03-15 – 2015-03-17 (×4): 0.5 mg via ORAL
  Filled 2015-03-15 (×4): qty 1

## 2015-03-15 NOTE — Plan of Care (Signed)
Problem: Alteration in mood & ability to function due to Goal: LTG-Pt reports reduction in suicidal thoughts (Patient reports reduction in suicidal thoughts and is able to verbalize a safety plan for whenever patient is feeling suicidal)  Outcome: Progressing Pt denies SI this shift.  She verbally contracts for safety.
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
## 2015-03-15 NOTE — Progress Notes (Signed)
[facility] Group Notes:  (Nursing/MHT/Case Management/Adjunct)  Date:  03/15/2015  Time:  7:58 PM  Type of Therapy:  Psychoeducational Skills  Participation Level:  Active  Participation Quality:  Appropriate  Affect:  Appropriate  Cognitive:  Appropriate  Insight:  Good  Engagement in Group:  Engaged  Modes of Intervention:  Discussion  Summary of Progress/Problems: Tonight in Wrap up group Giulietta said that her morning started off rough due to some anxiety, being in a new environment but he the groups and doctors have made her feel better. She also had a visitor and made her day even better. Jeanette Caprice 03/15/2015, 7:58 PM

## 2015-03-15 NOTE — Progress Notes (Signed)
Patient visible in dayroom though cautious, somewhat withdrawn. Patient flat, anxious both in affect and mood. States she did not want to come here as she feels she does not need this level of care.  Patient reports her goal is to "get out of here" and she plans to meet this goal by doing "whatever it takes." Patient does indicate however that her depression and anxiety are both at an 8/10 with hopelessness at a 6/10. Does complain of R shoulder pain currently at a 5/10 as tylenol helped some bringing it down from an 8/10. Order obtained for motrin and will admin after patient is out of group. Support, reassurance offered. Given vistaril prn as patient states she is feeling anxiety nearing a panic level.Patient denies SI/HI and is safe. Jamie Kato

## 2015-03-15 NOTE — [facility] Group Notes (Signed)
[place] LCSW Group Therapy 03/15/2015 1:15 PM  Type of Therapy: Group Therapy- Emotion Regulation  Participation Level: Active   Participation Quality:  Appropriate  Affect: Appropriate  Cognitive: Alert and Oriented   Insight:  Developing/Improving  Engagement in Therapy: Developing/Improving and Engaged   Modes of Intervention: Clarification, Confrontation, Discussion, Education, Exploration, Limit-setting, Orientation, Problem-solving, Rapport Building, Art therapist, Socialization and Support  Summary of Progress/Problems: The topic for group today was emotional regulation. This group focused on both positive and negative emotion identification and allowed group members to process ways to identify feelings, regulate negative emotions, and find healthy ways to manage internal/external emotions. Group members were asked to reflect on a time when their reaction to an emotion led to a negative outcome and explored how alternative responses using emotion regulation would have benefited them. Group members were also asked to discuss a time when emotion regulation was utilized when a negative emotion was experienced. Pt expressed that losing her job was a trigger to increasing depression. Pt missed a majority of the group discussion as she was meeting with the doctor. However, when present, she was attentive and interacted appropriately with peers.   [name], LCSWA 03/15/2015 4:13 PM

## 2015-03-15 NOTE — ED Provider Notes (Signed)
CSN: [id number]     Arrival date & time 03/14/15  1450 History   First MD Initiated Contact with Patient 03/14/15 1507     Chief Complaint  Patient presents with  . Suicidal     (Consider location/radiation/quality/duration/timing/severity/associated sxs/prior Treatment) HPI Patient presents to the emergency department with suicidal thoughts and depression.  Patient states that she would not kill herself, but she does have thoughts.  She states that she is very depressed and has gotten more depressed over the last few months.  Patient states that she is currently going through some family problems and losing her job.  She denies hallucinations, nausea, vomiting, weakness, dizziness, headache, blurred vision, chest pain, shortness breath, fever, back pain, neck pain, abdominal pain, or syncope.  The patient states that nothing seems make her condition better or worse Past Medical History  Diagnosis Date  . Anxiety   . Psoriasis   . History of colonic polyps     BENIGN  . Depression    Past Surgical History  Procedure Laterality Date  . Cesarean section  1995 & 2000    X2  . Tubal ligation  2007  . Tubal reversal  2008  . Colposcopy    . Anterior cruciate ligament repair  2011   Family History  Problem Relation Age of Onset  . Hypertension Mother   . Cancer Father 13    COLON  . Diabetes Brother   . Heart attack Brother     1/2 brother-maternal   History  Substance Use Topics  . Smoking status: Former Smoker -- 0.25 packs/day for 20 years    Types: Cigarettes  . Smokeless tobacco: Former Systems developer    Quit date: 11/12/2010  . Alcohol Use: Yes     Comment: 6 pack on Friday night, 6 pack on Saturday night   OB History    Gravida Para Term Preterm AB TAB SAB Ectopic Multiple Living   3 2   1  1   2      Review of Systems   All other systems negative except as documented in the HPI. All pertinent positives and negatives as reviewed in the HPI. Allergies  Review of  patient's allergies indicates no known allergies.  Home Medications   Prior to Admission medications   Medication Sig Start Date End Date Taking? Authorizing Provider  ALPRAZolam (XANAX) 0.5 MG tablet TAKE ONE TABLET BY MOUTH ONCE DAILY AT BEDTIME AS NEEDED Patient taking differently: TAKE 0.5 MG BY MOUTH DAILY AT BEDTIME AS NEEDED FOR SLEEP/ANXIETY 02/10/15  Yes Huel Cote, NP  FLUoxetine (PROZAC) 20 MG capsule Take 2 capsules (40 mg total) by mouth daily. 03/03/14  Yes Huel Cote, NP  ibuprofen (ADVIL,MOTRIN) 800 MG tablet Take 1 tablet (800 mg total) by mouth every 8 (eight) hours as needed. Patient taking differently: Take 800 mg by mouth every 8 (eight) hours as needed for headache, mild pain or moderate pain.  01/09/15  Yes Anastasio Auerbach, MD  metroNIDAZOLE (FLAGYL) 500 MG tablet Take 1 tablet (500 mg total) by mouth 2 (two) times daily. 03/03/14  Yes Huel Cote, NP  Multiple Vitamins-Calcium (ONE-A-DAY WOMENS FORMULA PO) Take by mouth.   Yes Historical Provider, MD  albuterol (PROVENTIL HFA;VENTOLIN HFA) 108 (90 BASE) MCG/ACT inhaler Inhale 2 puffs into the lungs every 4 (four) hours as needed for wheezing or shortness of breath. Patient not taking: Reported on 03/14/2015 01/13/14   Kristen N Ward, DO   BP 137/96 mmHg  Pulse 106  Temp(Src) 98.2 F (36.8 C) (Oral)  Resp 16  SpO2 100%  LMP 02/26/2015 Physical Exam  Constitutional: She is oriented to person, place, and time. She appears well-developed and well-nourished. No distress.  HENT:  Head: Normocephalic and atraumatic.  Mouth/Throat: Oropharynx is clear and moist.  Eyes: EOM are normal. Pupils are equal, round, and reactive to light.  Neck: Normal range of motion. Neck supple. No thyromegaly present.  Cardiovascular: Normal rate, regular rhythm and normal heart sounds.  Exam reveals no gallop and no friction rub.   No murmur heard. Pulmonary/Chest: Effort normal and breath sounds normal. No respiratory distress.   Neurological: She is alert and oriented to person, place, and time. She exhibits normal muscle tone. Coordination normal.  Skin: Skin is warm and dry. No rash noted. No erythema.  Psychiatric: Her behavior is normal. She exhibits a depressed mood. She expresses suicidal ideation. She expresses no suicidal plans.  Nursing note and vitals reviewed.   ED Course  Procedures (including critical care time) Labs Review Labs Reviewed  BASIC METABOLIC PANEL - Abnormal; Notable for the following:    Glucose, Bld 105 (*)    All other components within normal limits  CBC WITH DIFFERENTIAL/PLATELET - Abnormal; Notable for the following:    WBC 10.6 (*)    Neutrophils Relative % 79 (*)    Neutro Abs 8.3 (*)    All other components within normal limits  ETHANOL  URINE RAPID DRUG SCREEN, HOSP PERFORMED   the patient will be admitted to behavioral health for further evaluation and care for her depression and suicidal thoughts    Dalia Heading, PA-C 03/16/15 0105  Evelina Bucy, MD 03/16/15 2311

## 2015-03-15 NOTE — Plan of Care (Signed)
Problem: Ineffective individual coping Goal: STG: Patient will remain free from self harm Outcome: Progressing Patient has not engaged in self harm and denies SI  Problem: Alteration in mood; excessive anxiety as evidenced by: Goal: STG-Pt can identify coping skills to manage panic/anxiety (Patient can identify at least ____ coping skills to manage panic/anxiety attack)  Outcome: Progressing Patient feeling anxious this morning. Recognizes need for coping skills and elected to take a prn med as well.

## 2015-03-15 NOTE — Progress Notes (Signed)
Patient believes she might have a bladder infection. Reports pain and burning with urination. Will obtain VS. Order received to collect urine which is pending in fridge for lab pickup. Patient made aware. Fluids encouraged and patient verbalizes understanding. Will ask night RN to follow results as they become available. Will continue to monitor. Sydney Glover

## 2015-03-15 NOTE — Progress Notes (Signed)
Recreation Therapy Notes  Date: 07.20.16 Time: 9:30 am Location: [address] Group Room  Group Topic: Stress Management  Goal Area(s) Addresses:  Patient will verbalize importance of using healthy stress management.  Patient will identify positive emotions associated with healthy stress management.   Intervention: Stress Management  Activity :  Progressive Muscle Relaxation.  LRT will introduce and educate patients on the stress management technique of progressive muscle relaxation.  A script was used to present  the technique to the patients.  Patients were asked to follow a long with the script read a loud by the LRT.  Education:  Stress Management, Discharge Planning.   Education Outcome: Acknowledges edcuation/In group clarification offered/Needs additional education  Clinical Observations/Feedback: Patient did not attend group.   Victorino Sparrow, LRT/CTRS         Ria Comment, Shawn Carattini A 03/15/2015 10:04 AM

## 2015-03-15 NOTE — H&P (Signed)
Psychiatric Admission Assessment Adult  Patient Identification: Natalea Sutliff MRN:  [id number] Date of Evaluation:  03/15/2015 Chief Complaint: " I am having a lot of panic attacks"  Principal Diagnosis:  Panic Disorder with Agoraphobia and MDD  Diagnosis:   Patient Active Problem List   Diagnosis Date Noted  . MDD (major depressive disorder), recurrent episode, severe [F33.2] 03/14/2015  . Anxiety and depression [F41.8] 11/11/2012  . Personal history of colonic polyps [Z86.010] 11/11/2012   History of Present Illness:: 44 year old female, employed, divorced. She states that over the last several months to a year she has had increasingly frequent panic attacks, which she describes as episodes of increased anxiety, palpitations , sense of apprehension, nausea, tearfulness. These often occur without any clear trigger but she has developed agoraphobia and has been less sociable, and less likely to go to public places. Severity has increased to where she recently had to cut her vacation short. She also endorses depression with some anhedonia, decreased sense of self esteem, but stresses anxiety as major issue . She has been drinking in binges, mostly on weekends, but more over the last few days.  She states her ex mother in law lives with her , and is medically ill and physically frail. Patient ruminates " that I am going to come home to find her dead". This rumination has made it unpleasant for her to return home after work. She is facing some significant stressors, namely, states her job description has been terminated so her last day at work is 8/1, and she has financial difficulties . Elements:  Chronic/ Worsening anxiety, panic attacks, some worsening depression, in the context of some psychosocial stressors . Symptoms have worsened over recent days , leading to admission.  Associated Signs/Symptoms: Depression Symptoms:  Endorses anhedonia, decreased sense of self esteem , sleep and  appetite "OK" (Hypo) Manic Symptoms:   Denies  Anxiety Symptoms: (+) excessive worrying and panic attacks /agoraphobia as described above  Psychotic Symptoms:   Denies  PTSD Symptoms: Witnessed death of loved ones, father, when she was a child- some intrusive recollections about this. Total Time spent with patient: 45 minutes  Psychiatric History:  No prior psychiatric admissions, no prior suicide attempts, no history of psychosis, no history of mania, no history of violece. (+) Panic, Agoraphobia and excessive worrying as described above  States she has been on Prozac for a few years, and states that she feels it no longer helps. Takes Xanax ( 0.5 mgrs QHS) - denies abusing .  Past Medical History:  Past Medical History  Diagnosis Date  . Anxiety   . Psoriasis   . History of colonic polyps     BENIGN  . Depression     Past Surgical History  Procedure Laterality Date  . Cesarean section  1995 & 2000    X2  . Tubal ligation  2007  . Tubal reversal  2008  . Colposcopy    . Anterior cruciate ligament repair  2011   Family History:  Father died of colon cancer when patient was young, mother died during angioplasty procedure when patient 53 years old.  Grandmother had depression, no suicides in family,  Father was alcoholic . Family History  Problem Relation Age of Onset  . Hypertension Mother   . Cancer Father 14    COLON  . Diabetes Brother   . Heart attack Brother     1/2 brother-maternal   Social History: divorced, has 2 children, ages 28, 54, another  stressor is that she is distanced from her 42 year old due to her " making wrong decisions, using drugs". Employed as Radio broadcast assistant, as noted, concerned about losing job in the near future and about her ex mother in law being chronically ill and frail. History  Alcohol Use  . Yes    Comment: 6 pack on Friday night, 6 pack on Saturday night     History  Drug Use No    History   Social History  . Marital Status: Single     Spouse Name: N/A  . Number of Children: N/A  . Years of Education: N/A   Social History Main Topics  . Smoking status: Former Smoker -- 0.25 packs/day for 20 years    Types: Cigarettes  . Smokeless tobacco: Former Systems developer    Quit date: 11/12/2010  . Alcohol Use: Yes     Comment: 6 pack on Friday night, 6 pack on Saturday night  . Drug Use: No  . Sexual Activity: Yes    Birth Control/ Protection: None     Comment: none   Other Topics Concern  . None   Social History Narrative   Additional Social History:    Pain Medications: ibuprofen Prescriptions: albuterol   xanax   prozac   ibuprofen   flagyl   multivitamin Over the Counter: multivitamin History of alcohol / drug use?: Yes Longest period of sobriety (when/how long): occasion weekend beers Negative Consequences of Use: Personal relationships Withdrawal Symptoms: Other (Comment) (none)    Musculoskeletal: Strength & Muscle Tone: within normal limits Gait & Station: normal Patient leans: N/A  Psychiatric Specialty Exam: Physical Exam  Review of Systems  Constitutional: Negative.   HENT: Negative.   Eyes: Negative.   Respiratory: Negative.   Cardiovascular: Negative.   Gastrointestinal: Negative.   Genitourinary: Positive for dysuria and urgency.  Musculoskeletal: Negative.   Skin: Negative.   Neurological: Negative.   Endo/Heme/Allergies: Negative.   Psychiatric/Behavioral: Positive for depression. The patient is nervous/anxious.   all other systems negative   Blood pressure 112/65, pulse 87, temperature 97.9 F (36.6 C), temperature source Oral, resp. rate 16, height $RemoveBe'5\' 4"'DSNicqWmB$  (1.626 m), weight 250 lb (113.399 kg), last menstrual period 02/26/2015.Body mass index is 42.89 kg/(m^2).  General Appearance: Fairly Groomed  Engineer, water::  Good  Speech:  Normal Rate  Volume:  Normal  Mood:  depressed, anxious   Affect:  Constricted and anxious  Thought Process:  Linear  Orientation:  Full (Time, Place, and Person)   Thought Content:  no hallucinations, no delusions, ruminative about stressors   Suicidal Thoughts:  No- currently denies suicidal ideations or self injurious ideations  Homicidal Thoughts:  No  Memory:  recent and remote grossly intact  Judgement:  Other:  fair   Insight:  Fair  Psychomotor Activity:  Normal  Concentration:  Good  Recall:  Good  Fund of Knowledge:Good  Language: Good  Akathisia:  Negative  Handed:  Right  AIMS (if indicated):     Assets:  Communication Skills Desire for Improvement Resilience Vocational/Educational  ADL's:  Fair   Cognition: WNL  Sleep:  Number of Hours: 6.75   Risk to Self: Is patient at risk for suicide?: No What has been your use of drugs/alcohol within the last 12 months?:  (socially drinks on the weekends) Risk to Others:   Prior Inpatient Therapy:   Prior Outpatient Therapy:    Alcohol Screening: 1. How often do you have a drink containing alcohol?: 2 to 3 times  a week 2. How many drinks containing alcohol do you have on a typical day when you are drinking?: 5 or 6 3. How often do you have six or more drinks on one occasion?: Weekly Preliminary Score: 5 4. How often during the last year have you found that you were not able to stop drinking once you had started?: Weekly 5. How often during the last year have you failed to do what was normally expected from you becasue of drinking?: Weekly 6. How often during the last year have you needed a first drink in the morning to get yourself going after a heavy drinking session?: Less than monthly 7. How often during the last year have you had a feeling of guilt of remorse after drinking?: Weekly 8. How often during the last year have you been unable to remember what happened the night before because you had been drinking?: Monthly 9. Have you or someone else been injured as a result of your drinking?: No 10. Has a relative or friend or a doctor or another health worker been concerned about your  drinking or suggested you cut down?: No Alcohol Use Disorder Identification Test Final Score (AUDIT): 20 Brief Intervention: Yes  Allergies:  No Known Allergies Lab Results:  Results for orders placed or performed during the hospital encounter of 03/14/15 (from the past 48 hour(s))  Urine rapid drug screen (hosp performed)     Status: None   Collection Time: 03/14/15  3:20 PM  Result Value Ref Range   Opiates NONE DETECTED NONE DETECTED   Cocaine NONE DETECTED NONE DETECTED   Benzodiazepines NONE DETECTED NONE DETECTED   Amphetamines NONE DETECTED NONE DETECTED   Tetrahydrocannabinol NONE DETECTED NONE DETECTED   Barbiturates NONE DETECTED NONE DETECTED    Comment:        DRUG SCREEN FOR MEDICAL PURPOSES ONLY.  IF CONFIRMATION IS NEEDED FOR ANY PURPOSE, NOTIFY LAB WITHIN 5 DAYS.        LOWEST DETECTABLE LIMITS FOR URINE DRUG SCREEN Drug Class       Cutoff (ng/mL) Amphetamine      1000 Barbiturate      200 Benzodiazepine   585 Tricyclics       277 Opiates          300 Cocaine          300 THC              50   Basic metabolic panel     Status: Abnormal   Collection Time: 03/14/15  3:39 PM  Result Value Ref Range   Sodium 139 135 - 145 mmol/L   Potassium 4.5 3.5 - 5.1 mmol/L   Chloride 106 101 - 111 mmol/L   CO2 24 22 - 32 mmol/L   Glucose, Bld 105 (H) 65 - 99 mg/dL   BUN 14 6 - 20 mg/dL   Creatinine, Ser 0.72 0.44 - 1.00 mg/dL   Calcium 9.8 8.9 - 10.3 mg/dL   GFR calc non Af Amer >60 >60 mL/min   GFR calc Af Amer >60 >60 mL/min    Comment: (NOTE) The eGFR has been calculated using the CKD EPI equation. This calculation has not been validated in all clinical situations. eGFR's persistently <60 mL/min signify possible Chronic Kidney Disease.    Anion gap 9 5 - 15  CBC with Differential     Status: Abnormal   Collection Time: 03/14/15  3:39 PM  Result Value Ref Range   WBC 10.6 (H) 4.0 -  10.5 K/uL   RBC 4.51 3.87 - 5.11 MIL/uL   Hemoglobin 13.1 12.0 - 15.0 g/dL    HCT 41.9 36.0 - 46.0 %   MCV 92.9 78.0 - 100.0 fL   MCH 29.0 26.0 - 34.0 pg   MCHC 31.3 30.0 - 36.0 g/dL   RDW 13.7 11.5 - 15.5 %   Platelets 317 150 - 400 K/uL   Neutrophils Relative % 79 (H) 43 - 77 %   Neutro Abs 8.3 (H) 1.7 - 7.7 K/uL   Lymphocytes Relative 17 12 - 46 %   Lymphs Abs 1.8 0.7 - 4.0 K/uL   Monocytes Relative 4 3 - 12 %   Monocytes Absolute 0.4 0.1 - 1.0 K/uL   Eosinophils Relative 0 0 - 5 %   Eosinophils Absolute 0.0 0.0 - 0.7 K/uL   Basophils Relative 0 0 - 1 %   Basophils Absolute 0.0 0.0 - 0.1 K/uL  Ethanol     Status: None   Collection Time: 03/14/15  3:39 PM  Result Value Ref Range   Alcohol, Ethyl (B) <5 <5 mg/dL    Comment:        LOWEST DETECTABLE LIMIT FOR SERUM ALCOHOL IS 5 mg/dL FOR MEDICAL PURPOSES ONLY    Current Medications: Current Facility-Administered Medications  Medication Dose Route Frequency Provider Last Rate Last Dose  . acetaminophen (TYLENOL) tablet 650 mg  650 mg Oral Q6H PRN Niel Hummer, NP   650 mg at 03/15/15 1437  . alum & mag hydroxide-simeth (MAALOX/MYLANTA) 200-200-20 MG/5ML suspension 30 mL  30 mL Oral Q4H PRN Niel Hummer, NP      . citalopram (CELEXA) tablet 20 mg  20 mg Oral Daily Jenne Campus, MD   20 mg at 03/15/15 1435  . hydrOXYzine (ATARAX/VISTARIL) tablet 25 mg  25 mg Oral Q6H PRN Niel Hummer, NP   25 mg at 03/15/15 0809  . ibuprofen (ADVIL,MOTRIN) tablet 600 mg  600 mg Oral Q6H PRN Kerrie Buffalo, NP   600 mg at 03/15/15 1214  . LORazepam (ATIVAN) tablet 0.5 mg  0.5 mg Oral Q6H PRN Jenne Campus, MD   0.5 mg at 03/15/15 1435  . magnesium hydroxide (MILK OF MAGNESIA) suspension 30 mL  30 mL Oral Daily PRN Niel Hummer, NP      . traZODone (DESYREL) tablet 50 mg  50 mg Oral QHS PRN Niel Hummer, NP   50 mg at 03/14/15 2120   PTA Medications: Prescriptions prior to admission  Medication Sig Dispense Refill Last Dose  . albuterol (PROVENTIL HFA;VENTOLIN HFA) 108 (90 BASE) MCG/ACT inhaler Inhale 2 puffs  into the lungs every 4 (four) hours as needed for wheezing or shortness of breath. (Patient not taking: Reported on 03/14/2015) 1 Inhaler 0 Not Taking at Unknown time  . ALPRAZolam (XANAX) 0.5 MG tablet TAKE ONE TABLET BY MOUTH ONCE DAILY AT BEDTIME AS NEEDED (Patient taking differently: TAKE 0.5 MG BY MOUTH DAILY AT BEDTIME AS NEEDED FOR SLEEP/ANXIETY) 30 tablet 1 2 weeks  . FLUoxetine (PROZAC) 20 MG capsule Take 2 capsules (40 mg total) by mouth daily. 60 capsule 12 03/14/2015 at Unknown time  . ibuprofen (ADVIL,MOTRIN) 800 MG tablet Take 1 tablet (800 mg total) by mouth every 8 (eight) hours as needed. (Patient taking differently: Take 800 mg by mouth every 8 (eight) hours as needed for headache, mild pain or moderate pain. ) 30 tablet 2 Past Month at Unknown time  . metroNIDAZOLE (FLAGYL) 500 MG  tablet Take 1 tablet (500 mg total) by mouth 2 (two) times daily. 14 tablet 0 Past Month at Unknown time  . Multiple Vitamins-Calcium (ONE-A-DAY WOMENS FORMULA PO) Take by mouth.   03/14/2015 at Unknown time    Previous Psychotropic Medications:  ( on prozac and Xanax- at low doses- for years. States Prozac seems to " have stopped working". )  Substance Abuse History in the last 12 months:  Yes.  - drinks 6-10 beers per day on weekends, denies weekday drinking ,  denies any drug abuse .     Consequences of Substance Abuse: Denies   Results for orders placed or performed during the hospital encounter of 03/14/15 (from the past 72 hour(s))  Urine rapid drug screen (hosp performed)     Status: None   Collection Time: 03/14/15  3:20 PM  Result Value Ref Range   Opiates NONE DETECTED NONE DETECTED   Cocaine NONE DETECTED NONE DETECTED   Benzodiazepines NONE DETECTED NONE DETECTED   Amphetamines NONE DETECTED NONE DETECTED   Tetrahydrocannabinol NONE DETECTED NONE DETECTED   Barbiturates NONE DETECTED NONE DETECTED    Comment:        DRUG SCREEN FOR MEDICAL PURPOSES ONLY.  IF CONFIRMATION IS  NEEDED FOR ANY PURPOSE, NOTIFY LAB WITHIN 5 DAYS.        LOWEST DETECTABLE LIMITS FOR URINE DRUG SCREEN Drug Class       Cutoff (ng/mL) Amphetamine      1000 Barbiturate      200 Benzodiazepine   448 Tricyclics       185 Opiates          300 Cocaine          300 THC              50   Basic metabolic panel     Status: Abnormal   Collection Time: 03/14/15  3:39 PM  Result Value Ref Range   Sodium 139 135 - 145 mmol/L   Potassium 4.5 3.5 - 5.1 mmol/L   Chloride 106 101 - 111 mmol/L   CO2 24 22 - 32 mmol/L   Glucose, Bld 105 (H) 65 - 99 mg/dL   BUN 14 6 - 20 mg/dL   Creatinine, Ser 0.72 0.44 - 1.00 mg/dL   Calcium 9.8 8.9 - 10.3 mg/dL   GFR calc non Af Amer >60 >60 mL/min   GFR calc Af Amer >60 >60 mL/min    Comment: (NOTE) The eGFR has been calculated using the CKD EPI equation. This calculation has not been validated in all clinical situations. eGFR's persistently <60 mL/min signify possible Chronic Kidney Disease.    Anion gap 9 5 - 15  CBC with Differential     Status: Abnormal   Collection Time: 03/14/15  3:39 PM  Result Value Ref Range   WBC 10.6 (H) 4.0 - 10.5 K/uL   RBC 4.51 3.87 - 5.11 MIL/uL   Hemoglobin 13.1 12.0 - 15.0 g/dL   HCT 41.9 36.0 - 46.0 %   MCV 92.9 78.0 - 100.0 fL   MCH 29.0 26.0 - 34.0 pg   MCHC 31.3 30.0 - 36.0 g/dL   RDW 13.7 11.5 - 15.5 %   Platelets 317 150 - 400 K/uL   Neutrophils Relative % 79 (H) 43 - 77 %   Neutro Abs 8.3 (H) 1.7 - 7.7 K/uL   Lymphocytes Relative 17 12 - 46 %   Lymphs Abs 1.8 0.7 - 4.0 K/uL   Monocytes Relative  4 3 - 12 %   Monocytes Absolute 0.4 0.1 - 1.0 K/uL   Eosinophils Relative 0 0 - 5 %   Eosinophils Absolute 0.0 0.0 - 0.7 K/uL   Basophils Relative 0 0 - 1 %   Basophils Absolute 0.0 0.0 - 0.1 K/uL  Ethanol     Status: None   Collection Time: 03/14/15  3:39 PM  Result Value Ref Range   Alcohol, Ethyl (B) <5 <5 mg/dL    Comment:        LOWEST DETECTABLE LIMIT FOR SERUM ALCOHOL IS 5 mg/dL FOR MEDICAL  PURPOSES ONLY     Observation Level/Precautions:  15 minute checks  Laboratory:  UA , U culture to rule out UTI, TSH  Psychotherapy:  Supportive , milieu, group  Medications:  Agrees to CELEXA trial- we discussed side effects. Start Celexa 20 mgrs QDAY,  Ativan PRNs for severe anxiety , if needed   Consultations:  If needed   Discharge Concerns:  -  Estimated LOS: 6 days  Other:     Psychological Evaluations: no   Treatment Plan Summary: Daily contact with patient to assess and evaluate symptoms and progress in treatment, Medication management, Plan inpatient admission and medicaitons as above   Medical Decision Making:  Review of Psycho-Social Stressors (1), Review or order clinical lab tests (1), Established Problem, Worsening (2) and Review of New Medication or Change in Dosage (2)  I certify that inpatient services furnished can reasonably be expected to improve the patient's condition.   Neita Garnet 7/20/20164:17 PM
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
## 2015-03-15 NOTE — BHH Group Notes (Signed)
[facility] LCSW Aftercare Discharge Planning Group Note  03/15/2015 8:45 AM  Participation Quality: Alert, Appropriate and Oriented  Mood/Affect: Flat  Depression Rating: 8  Anxiety Rating: 5  Thoughts of Suicide: Pt denies SI/HI  Will you contract for safety? Yes  Current AVH: Pt denies  Plan for Discharge/Comments: Pt attended discharge planning group and actively participated in group. CSW discussed suicide prevention education with the group and encouraged them to discuss discharge planning and any relevant barriers. Pt was reserved in group discussion, presenting with anxious affect. Pt reports that she will return home and needs referral for outpatient services.  Transportation Means: Pt reports access to transportation  Supports: No supports mentioned at this time  [name], [place] 03/15/2015 12:23 PM

## 2015-03-15 NOTE — BHH Suicide Risk Assessment (Signed)
[place] INPATIENT:  Family/Significant Other Suicide Prevention Education  Suicide Prevention Education:  Patient Refusal for Family/Significant Other Suicide Prevention Education: The patient Sydney Glover has refused to provide written consent for family/significant other to be provided Family/Significant Other Suicide Prevention Education during admission and/or prior to discharge.  Physician notified.  Ludwig Clarks 03/15/2015, 3:59 PM

## 2015-03-16 LAB — URINALYSIS W MICROSCOPIC (NOT AT ARMC)
Bilirubin Urine: NEGATIVE
Glucose, UA: NEGATIVE mg/dL
Hgb urine dipstick: NEGATIVE
Ketones, ur: NEGATIVE mg/dL
Nitrite: NEGATIVE
Protein, ur: NEGATIVE mg/dL
Specific Gravity, Urine: 1.01 (ref 1.005–1.030)
Urobilinogen, UA: 0.2 mg/dL (ref 0.0–1.0)
pH: 5.5 (ref 5.0–8.0)

## 2015-03-16 LAB — TSH: TSH: 3.955 u[IU]/mL (ref 0.350–4.500)

## 2015-03-16 MED ORDER — NICOTINE 14 MG/24HR TD PT24
14.0000 mg | MEDICATED_PATCH | Freq: Every day | TRANSDERMAL | Status: DC
  Filled 2015-03-16 (×4): qty 1

## 2015-03-16 NOTE — Progress Notes (Signed)
D: Pt has anxious affect and mood.  She reports she had a good visit with her boyfriend tonight.  Pt reports her goal today was "to get a little step closer to getting out of here."  Pt denies SI/HI, denies hallucinations, reports right shoulder pain of 7/10.  Pt has been visible in milieu interacting with peers and staff appropriately.  Pt attended evening group.   A: Introduced self to pt.  Met with pt 1:1 and provided support and encouragement.  Actively listened to pt. PRN medication administered for pain, anxiety, and sleep.  Heat pack provided for pain.   R: Pt is compliant with medications.  Pt verbally contracts for safety.  Will continue to monitor and assess.

## 2015-03-16 NOTE — Progress Notes (Addendum)
D:  Per pt self inventory pt reports sleeping good, appetite good, energy level low, ability to pay attention good, rates depression at a 6 out of 10, hopelessness at a 4 out of 10, anxiety at a 9 out of 10, anxious/depressed during interaction, denies SI/HI/AVH, goal today: "getting myself back together and make sure meds are the correct ones for my needs, go to groups and talk with doctors and counselor"     A:  Emotional support provided, Encouraged pt to continue with treatment plan and attend all group activities, q15 min checks maintained for safety.  R:  Pt is flat/depressed, c/o some anxiety--due to "family conflict", going to groups, pleasant and cooperative with staff and other patients on the unit.

## 2015-03-16 NOTE — Progress Notes (Signed)
Patient attended karaoke and sang along with others.

## 2015-03-16 NOTE — Progress Notes (Signed)
[facility] MD Progress Note  03/16/2015 1:56 PM Sydney Glover  MRN:  [id number] Subjective:   Patient states she is better compared to her admission. She does still feel depressed and anxious , however. She ruminates about her ex mother in law finding out she is in a psychiatric hospital " and telling my daughter about it- I  did not want her to be upset ". At this time denies medication side effects. She states anxiety is improved and has not had any full blown panic attacks on unit thus far . Objective : I have discussed case with treatment team and have met with patient. Denies any medication side effects at present and has been going to groups / is visible in milieu. No disruptive or agitated behaviors on unit. As noted , ruminative about family knowing about her admission, but responsive to support, empathy. I offered her family meeting if she felt this would help, but she declined . At this time feeling less depressed . TSH Normal, UA unremarkable and at this time patient denying any fever, chills, dysuria, urgency.   Principal Problem: Panic disorder with agoraphobia Diagnosis:   Patient Active Problem List   Diagnosis Date Noted  . Panic disorder with agoraphobia [F40.01] 03/15/2015  . MDD (major depressive disorder), recurrent episode, severe [F33.2] 03/14/2015  . Anxiety and depression [F41.8] 11/11/2012  . Personal history of colonic polyps [Z86.010] 11/11/2012   Total Time spent with patient: 25 minutes    Past Medical History:  Past Medical History  Diagnosis Date  . Anxiety   . Psoriasis   . History of colonic polyps     BENIGN  . Depression     Past Surgical History  Procedure Laterality Date  . Cesarean section  1995 & 2000    X2  . Tubal ligation  2007  . Tubal reversal  2008  . Colposcopy    . Anterior cruciate ligament repair  2011   Family History:  Family History  Problem Relation Age of Onset  . Hypertension Mother   . Cancer Father 76    COLON   . Diabetes Brother   . Heart attack Brother     1/2 brother-maternal   Social History:  History  Alcohol Use  . Yes    Comment: 6 pack on Friday night, 6 pack on Saturday night     History  Drug Use No    History   Social History  . Marital Status: Single    Spouse Name: N/A  . Number of Children: N/A  . Years of Education: N/A   Social History Main Topics  . Smoking status: Former Smoker -- 0.25 packs/day for 20 years    Types: Cigarettes  . Smokeless tobacco: Former Systems developer    Quit date: 11/12/2010  . Alcohol Use: Yes     Comment: 6 pack on Friday night, 6 pack on Saturday night  . Drug Use: No  . Sexual Activity: Yes    Birth Control/ Protection: None     Comment: none   Other Topics Concern  . None   Social History Narrative   Additional History:    Sleep: Good  Appetite:  Good   Assessment:   Musculoskeletal: Strength & Muscle Tone: within normal limits Gait & Station: normal Patient leans: N/A   Psychiatric Specialty Exam: Physical Exam  ROS- denies chest pain, denies SOB,   Blood pressure 141/85, pulse 90, temperature 97.9 F (36.6 C), temperature source Oral, resp. rate 16, height  _0  (1.626 m), weight 250 lb (113.399 kg), last menstrual period 02/26/2015.Body mass index is 42.89 kg/(m^2).  General Appearance: improved grooming  Eye Contact::  Good  Speech:  Normal Rate  Volume:  Normal  Mood:  less depressed,some anxiety  Affect:  less constricted, anxious, more reactive   Thought Process:  Goal Directed and Linear  Orientation:  Full (Time, Place, and Person)  Thought Content:  no hallucinations, no delusions, ruminative about family issues   Suicidal Thoughts:  No- at this time denies any thoughts of wanting to hurt self or of SI  Homicidal Thoughts:  No  Memory:  recent and remote grossly intact   Judgement:  Improved   Insight: present  Psychomotor Activity:  Normal  Concentration:  Good  Recall:  Good  Fund of Knowledge:Good   Language: Good  Akathisia:  Negative  Handed:  Right  AIMS (if indicated):     Assets:  Communication Skills Desire for Improvement  ADL's: improved   Cognition: WNL  Sleep:  Number of Hours: 6.75     Current Medications: Current Facility-Administered Medications  Medication Dose Route Frequency Provider Last Rate Last Dose  . acetaminophen (TYLENOL) tablet 650 mg  650 mg Oral Q6H PRN Niel Hummer, NP   650 mg at 03/16/15 1019  . alum & mag hydroxide-simeth (MAALOX/MYLANTA) 200-200-20 MG/5ML suspension 30 mL  30 mL Oral Q4H PRN Niel Hummer, NP      . citalopram (CELEXA) tablet 20 mg  20 mg Oral Daily Jenne Campus, MD   20 mg at 03/16/15 0813  . hydrOXYzine (ATARAX/VISTARIL) tablet 25 mg  25 mg Oral Q6H PRN Niel Hummer, NP   25 mg at 03/15/15 2122  . ibuprofen (ADVIL,MOTRIN) tablet 600 mg  600 mg Oral Q6H PRN Kerrie Buffalo, NP   600 mg at 03/15/15 2122  . LORazepam (ATIVAN) tablet 0.5 mg  0.5 mg Oral Q6H PRN Jenne Campus, MD   0.5 mg at 03/16/15 1019  . magnesium hydroxide (MILK OF MAGNESIA) suspension 30 mL  30 mL Oral Daily PRN Niel Hummer, NP      . nicotine (NICODERM CQ - dosed in mg/24 hours) patch 14 mg  14 mg Transdermal Daily Jenne Campus, MD   14 mg at 03/16/15 0815  . traZODone (DESYREL) tablet 50 mg  50 mg Oral QHS PRN Niel Hummer, NP   50 mg at 03/15/15 2122    Lab Results:  Results for orders placed or performed during the hospital encounter of 03/14/15 (from the past 48 hour(s))  TSH     Status: None   Collection Time: 03/16/15  6:33 AM  Result Value Ref Range   TSH 3.955 0.350 - 4.500 uIU/mL    Comment: Performed at [facility]  Urinalysis with microscopic     Status: Abnormal   Collection Time: 03/16/15  6:56 AM  Result Value Ref Range   Color, Urine YELLOW YELLOW   APPearance CLEAR CLEAR   Specific Gravity, Urine 1.010 1.005 - 1.030   pH 5.5 5.0 - 8.0   Glucose, UA NEGATIVE NEGATIVE mg/dL   Hgb urine dipstick NEGATIVE  NEGATIVE   Bilirubin Urine NEGATIVE NEGATIVE   Ketones, ur NEGATIVE NEGATIVE mg/dL   Protein, ur NEGATIVE NEGATIVE mg/dL   Urobilinogen, UA 0.2 0.0 - 1.0 mg/dL   Nitrite NEGATIVE NEGATIVE   Leukocytes, UA TRACE (A) NEGATIVE   WBC, UA 3-6 <3 WBC/hpf   Bacteria, UA RARE  RARE   Squamous Epithelial / LPF RARE RARE    Comment: Performed at [facility]    Physical Findings: AIMS: Facial and Oral Movements Muscles of Facial Expression: None, normal Lips and Perioral Area: None, normal Jaw: None, normal Tongue: None, normal,Extremity Movements Upper (arms, wrists, hands, fingers): None, normal Lower (legs, knees, ankles, toes): None, normal, Trunk Movements Neck, shoulders, hips: None, normal, Overall Severity Severity of abnormal movements (highest score from questions above): None, normal Incapacitation due to abnormal movements: None, normal Patient's awareness of abnormal movements (rate only patient's report): No Awareness, Dental Status Current problems with teeth and/or dentures?: No Does patient usually wear dentures?: No  CIWA:  CIWA-Ar Total: 3 COWS:  COWS Total Score: 4   Assessment- at this time patient's mood is improved, is less depressed.Anxiety does persist, and today ruminates about family and how they might react to knowing she is in psychiatric unit. Has not had any panic attacks on unit, and has been active in groups, not isolative. Tolerating medications well- denies side effects thus far.  No current urinary symptoms reported and UA unremarkable .  Treatment Plan Summary: Daily contact with patient to assess and evaluate symptoms and progress in treatment, Medication management, Plan inpatient treatment and medications as below   Celexa 20 mgrs QDAY for depression and for anxiety/panic. Ativan 0.5 mgrs Q 6 hours PRN for anxiety, panic as needed  Trazodone 50 mgrs QHS PRN Insomnia, for sleep. Patient not currently interested in family meeting  .  Medical Decision Making:  Established Problem, Stable/Improving (1), Review of Psycho-Social Stressors (1), Review or order clinical lab tests (1) and Review of Medication Regimen & Side Effects (2)     Efraim Vanallen 03/16/2015, 1:56 PM

## 2015-03-16 NOTE — B[facility] Group Notes (Signed)
[facility] [employer] Group Therapy 03/16/2015 1:15pm  Type of Therapy: Mental Health Association Presentation  Participation Level: Active  Participation Quality: Attentive  Affect: Appropriate  Cognitive: Oriented  Insight: Developing/Improving  Engagement in Therapy: Engaged  Modes of Intervention: Discussion, Education and Socialization  Summary of Progress/Problems: Mental Health Association ([place]) Speaker came to talk about his personal journey with substance abuse and addiction. The pt processed ways by which to relate to the speaker. [place] speaker provided handouts and educational information pertaining to groups and services offered by the [facility]. Pt was engaged in speaker's presentation and was receptive to resources provided.    [name], LCSWA 03/16/2015 1:32 PM

## 2015-03-17 ENCOUNTER — Encounter (HOSPITAL_COMMUNITY): Payer: Self-pay | Admitting: Registered Nurse

## 2015-03-17 DIAGNOSIS — F332 Major depressive disorder, recurrent severe without psychotic features: Secondary | ICD-10-CM | POA: Insufficient documentation

## 2015-03-17 MED ORDER — CITALOPRAM HYDROBROMIDE 20 MG PO TABS: 20.0000 mg | ORAL_TABLET | Freq: Every day | ORAL | Status: DC

## 2015-03-17 MED ORDER — TRAZODONE HCL 50 MG PO TABS: 50.0000 mg | ORAL_TABLET | Freq: Every evening | ORAL | Status: DC | PRN

## 2015-03-17 MED ORDER — HYDROXYZINE HCL 25 MG PO TABS
25.0000 mg | ORAL_TABLET | Freq: Three times a day (TID) | ORAL | Status: DC | PRN
  Filled 2015-03-17: qty 20

## 2015-03-17 MED ORDER — ALBUTEROL SULFATE HFA 108 (90 BASE) MCG/ACT IN AERS: 2.0000 | INHALATION_SPRAY | RESPIRATORY_TRACT | Status: DC | PRN

## 2015-03-17 MED ORDER — HYDROXYZINE HCL 25 MG PO TABS: 25.0000 mg | ORAL_TABLET | Freq: Three times a day (TID) | ORAL | Status: DC | PRN

## 2015-03-17 NOTE — BHH Group Notes (Signed)
[facility] LCSW Aftercare Discharge Planning Group Note  03/17/2015 8:45 AM  Participation Quality: Alert, Appropriate and Oriented  Mood/Affect: Appropriate  Depression Rating: 4  Anxiety Rating: 6  Thoughts of Suicide: Pt denies SI/HI  Will you contract for safety? Yes  Current AVH: Pt denies  Plan for Discharge/Comments: Pt attended discharge planning group and actively participated in group. CSW discussed suicide prevention education with the group and encouraged them to discuss discharge planning and any relevant barriers. Pt reports improved mood and expresses feeling ready for discharge at this time.  Transportation Means: Pt reports access to transportation  Supports: No supports mentioned at this time  [name], [place] 03/17/2015 10:21 AM

## 2015-03-17 NOTE — Progress Notes (Signed)
  [facility] Adult Case Management Discharge Plan :  Will you be returning to the same living situation after discharge:  Yes,  Pt will return to her home At discharge, do you have transportation home?: Yes,  Pt's boyfriend to provide transportation Do you have the ability to pay for your medications: Yes,  Pt provided with samples and prescriptions  Release of information consent forms completed and in the chart;  Patient's signature needed at discharge.  Patient to Follow up at: Follow-up Information    Follow up with [facility]. Go on 03/20/2015.   Why:  Monday, 8:30am appointment   Contact information:   [address]  [place] [address] [phone]       Patient denies SI/HI: Yes,  Pt denies    Safety Planning and Suicide Prevention discussed: Yes,  with Pt. Declines family contact  Have you used any form of tobacco in the last 30 days? (Cigarettes, Smokeless Tobacco, Cigars, and/or Pipes): Yes  Has patient been referred to the Quitline?: Patient refused referral  Bo Mcclintock 03/17/2015, 1:04 PM

## 2015-03-17 NOTE — Progress Notes (Signed)
D:  Per pt self inventory pt reports sleeping good, appetitegood, energy level low, ability to pay attention good, rates depression at a 5 out of 10, hopelessness at a 0 out of 10, anxiety at an 8 out of 10, denies SI/HI/AVH, pleasant and bright during interaction, goal today: "work on doing what it takes to go home"     A:  Emotional support provided, Encouraged pt to continue with treatment plan and attend all group activities, q15 min checks maintained for safety.  R:  Pt is receptive, going to all groups and activities, pleasant and cooperative with staff and other patients on the unit.

## 2015-03-17 NOTE — Discharge Summary (Signed)
Physician Discharge Summary Note  Patient:  Sydney Glover is an 44 y.o., female MRN:  [id number] DOB:  25-Oct-1970 Patient phone:  [phone] (home)  Patient address:   [address] [place] [address],  Total Time spent with patient: 45 minutes  Date of Admission:  03/14/2015 Date of Discharge: 03/17/2015  Reason for Admission:  Per H&P Note: 44 year old female, employed, divorced. She states that over the last several months to a year she has had increasingly frequent panic attacks, which she describes as episodes of increased anxiety, palpitations , sense of apprehension, nausea, tearfulness. These often occur without any clear trigger but she has developed agoraphobia and has been less sociable, and less likely to go to public places. Severity has increased to where she recently had to cut her vacation short. She also endorses depression with some anhedonia, decreased sense of self esteem, but stresses anxiety as major issue . She has been drinking in binges, mostly on weekends, but more over the last few days.  She states her ex mother in law lives with her , and is medically ill and physically frail. Patient ruminates " that I am going to come home to find her dead". This rumination has made it unpleasant for her to return home after work. She is facing some significant stressors, namely, states her job description has been terminated so her last day at work is 8/1, and she has financial difficulties .   Principal Problem: Panic disorder with agoraphobia Discharge Diagnoses: Patient Active Problem List   Diagnosis Date Noted  . Major depressive disorder, recurrent, severe without psychotic features [F33.2]   . Panic disorder with agoraphobia [F40.01] 03/15/2015  . MDD (major depressive disorder), recurrent episode, severe [F33.2] 03/14/2015  . Anxiety and depression [F41.8] 11/11/2012  . Personal history of colonic polyps [Z86.010] 11/11/2012     Musculoskeletal: Strength & Muscle Tone: within normal limits Gait & Station: normal Patient leans: N/A  Psychiatric Specialty Exam:  See Suicide Risk Assessment Physical Exam  Nursing note and vitals reviewed. Constitutional: She is oriented to person, place, and time.  Neck: Normal range of motion.  Respiratory: Effort normal.  Musculoskeletal: Normal range of motion.  Neurological: She is alert and oriented to person, place, and time.    Review of Systems  Psychiatric/Behavioral: Negative for suicidal ideas and hallucinations. Depression: Stable. Nervous/anxious: Stable. Insomnia: stable.   All other systems reviewed and are negative.   Blood pressure 120/72, pulse 90, temperature 99.3 F (37.4 C), temperature source Oral, resp. rate 20, height 5\' 4"  (1.626 m), weight 113.399 kg (250 lb), last menstrual period 02/26/2015.Body mass index is 42.89 kg/(m^2).  Have you used any form of tobacco in the last 30 days? (Cigarettes, Smokeless Tobacco, Cigars, and/or Pipes): Yes  Has this patient used any form of tobacco in the last 30 days? (Cigarettes, Smokeless Tobacco, Cigars, and/or Pipes) Yes, A prescription for an FDA-approved tobacco cessation medication was offered at discharge and the patient refused  Past Medical History:  Past Medical History  Diagnosis Date  . Anxiety   . Psoriasis   . History of colonic polyps     BENIGN  . Depression     Past Surgical History  Procedure Laterality Date  . Cesarean section  1995 & 2000    X2  . Tubal ligation  2007  . Tubal reversal  2008  . Colposcopy    . Anterior cruciate ligament repair  2011   Family History:  Family History  Problem Relation Age of Onset  . Hypertension Mother   . Cancer Father 68    COLON  . Diabetes Brother   . Heart attack Brother     1/2 brother-maternal   Social History:  History  Alcohol Use  . Yes    Comment: 6 pack on Friday night, 6 pack on Saturday night     History  Drug Use No     History   Social History  . Marital Status: Single    Spouse Name: N/A  . Number of Children: N/A  . Years of Education: N/A   Social History Main Topics  . Smoking status: Former Smoker -- 0.25 packs/day for 20 years    Types: Cigarettes  . Smokeless tobacco: Former Systems developer    Quit date: 11/12/2010  . Alcohol Use: Yes     Comment: 6 pack on Friday night, 6 pack on Saturday night  . Drug Use: No  . Sexual Activity: Yes    Birth Control/ Protection: None     Comment: none   Other Topics Concern  . None   Social History Narrative    Risk to Self: Is patient at risk for suicide?: No What has been your use of drugs/alcohol within the last 12 months?:  (socially drinks on the weekends) Risk to Others:   Prior Inpatient Therapy:   Prior Outpatient Therapy:    Level of Care:  OP  Hospital Course:  Sydney Glover was admitted for Panic disorder with agoraphobia and crisis management.  She was treated discharged with the medications listed below under Medication List.  Medical problems were identified and treated as needed.  Home medications were restarted as appropriate.  Improvement was monitored by observation and Sydney Glover daily report of symptom reduction.  Emotional and mental status was monitored by daily self-inventory reports completed by Sydney Glover and clinical staff.         Sydney Glover was evaluated by the treatment team for stability and plans for continued recovery upon discharge.  Sydney Glover motivation was an integral factor for scheduling further treatment.  Employment, transportation, bed availability, health status, family support, and any pending legal issues were also considered during her hospital stay.  She was offered further treatment options upon discharge including but not limited to Residential, Intensive Outpatient, and Outpatient treatment.  Sydney Glover will follow up with the services as listed  below under Follow Up Information.     Upon completion of this admission the patient was both mentally and medically stable for discharge denying suicidal/homicidal ideation, auditory/visual/tactile hallucinations, delusional thoughts and paranoia.      Consults:  psychiatry  Significant Diagnostic Studies:  labs: Urinalysis, Urine culture, TSH, BMP, CBC/Diff, ETOH, UDS,  Discharge Vitals:   Blood pressure 120/72, pulse 90, temperature 99.3 F (37.4 C), temperature source Oral, resp. rate 20, height 5\' 4"  (1.626 m), weight 113.399 kg (250 lb), last menstrual period 02/26/2015. Body mass index is 42.89 kg/(m^2). Lab Results:   Results for orders placed or performed during the hospital encounter of 03/14/15 (from the past 72 hour(s))  TSH     Status: None   Collection Time: 03/16/15  6:33 AM  Result Value Ref Range   TSH 3.955 0.350 - 4.500 uIU/mL    Comment: Performed at [facility]  Urine culture     Status: None (Preliminary result)   Collection Time: 03/16/15  6:55 AM  Result Value Ref Range  Specimen Description      URINE, CLEAN CATCH Performed at [facility]    Special Requests      NONE Performed at [facility]    Culture      20,000 COLONIES/mL ESCHERICHIA COLI Performed at [facility]    Report Status PENDING   Urinalysis with microscopic     Status: Abnormal   Collection Time: 03/16/15  6:56 AM  Result Value Ref Range   Color, Urine YELLOW YELLOW   APPearance CLEAR CLEAR   Specific Gravity, Urine 1.010 1.005 - 1.030   pH 5.5 5.0 - 8.0   Glucose, UA NEGATIVE NEGATIVE mg/dL   Hgb urine dipstick NEGATIVE NEGATIVE   Bilirubin Urine NEGATIVE NEGATIVE   Ketones, ur NEGATIVE NEGATIVE mg/dL   Protein, ur NEGATIVE NEGATIVE mg/dL   Urobilinogen, UA 0.2 0.0 - 1.0 mg/dL   Nitrite NEGATIVE NEGATIVE   Leukocytes, UA TRACE (A) NEGATIVE   WBC, UA 3-6 <3 WBC/hpf   Bacteria, UA RARE RARE   Squamous Epithelial  / LPF RARE RARE    Comment: Performed at [facility]    Physical Findings: AIMS: Facial and Oral Movements Muscles of Facial Expression: None, normal Lips and Perioral Area: None, normal Jaw: None, normal Tongue: None, normal,Extremity Movements Upper (arms, wrists, hands, fingers): None, normal Lower (legs, knees, ankles, toes): None, normal, Trunk Movements Neck, shoulders, hips: None, normal, Overall Severity Severity of abnormal movements (highest score from questions above): None, normal Incapacitation due to abnormal movements: None, normal Patient's awareness of abnormal movements (rate only patient's report): No Awareness, Dental Status Current problems with teeth and/or dentures?: No Does patient usually wear dentures?: No  CIWA:  CIWA-Ar Total: 3 COWS:  COWS Total Score: 4   See Psychiatric Specialty Exam and Suicide Risk Assessment completed by Attending Physician prior to discharge.  Discharge destination:  Home  Is patient on multiple antipsychotic therapies at discharge:  No   Has Patient had three or more failed trials of antipsychotic monotherapy by history:  No    Recommended Plan for Multiple Antipsychotic Therapies: NA      Discharge Instructions    Activity as tolerated - No restrictions    Complete by:  As directed      Diet general    Complete by:  As directed      Discharge instructions    Complete by:  As directed   Take all of you medications as prescribed by your mental healthcare provider.  Report any adverse effects and reactions from your medications to your outpatient provider promptly. Do not engage in alcohol and or illegal drug use while on prescription medicines. In the event of worsening symptoms call the crisis hotline, 911, and or go to the nearest emergency department for appropriate evaluation and treatment of symptoms. Follow-up with your primary care provider for your medical issues, concerns and or health care  needs.   Keep all scheduled appointments.  If you are unable to keep an appointment call to reschedule.  Let the nurse know if you will need medications before next scheduled appointment.            Medication List    STOP taking these medications        ALPRAZolam 0.5 MG tablet  Commonly known as:  XANAX     FLUoxetine 20 MG capsule  Commonly known as:  PROZAC     ibuprofen 800 MG tablet  Commonly known as:  ADVIL,MOTRIN  metroNIDAZOLE 500 MG tablet  Commonly known as:  FLAGYL      TAKE these medications      Indication   albuterol 108 (90 BASE) MCG/ACT inhaler  Commonly known as:  PROVENTIL HFA;VENTOLIN HFA  Inhale 2 puffs into the lungs every 4 (four) hours as needed for wheezing or shortness of breath.   Indication:  Asthma     citalopram 20 MG tablet  Commonly known as:  CELEXA  Take 1 tablet (20 mg total) by mouth daily.   Indication:  Depression     hydrOXYzine 25 MG tablet  Commonly known as:  ATARAX/VISTARIL  Take 1 tablet (25 mg total) by mouth 3 (three) times daily as needed for anxiety.   Indication:  Anxiety Neurosis     ONE-A-DAY WOMENS FORMULA PO  Take by mouth.      traZODone 50 MG tablet  Commonly known as:  DESYREL  Take 1 tablet (50 mg total) by mouth at bedtime as needed for sleep.   Indication:  Trouble Sleeping       Follow-up Information    Follow up with [facility]. Go on 03/20/2015.   Why:  Monday, 8:30am appointment   Contact information:   [address]  [place] [address] [phone]       Follow-up recommendations:  Activity:  As tolerated Diet:  As tolerated  Comments:   Patient has been instructed to take medications as prescribed; and report adverse effects to outpatient provider.  Follow up with primary doctor for any medical issues and If symptoms recur report to nearest emergency or crisis hot line.    Total Discharge Time: 6 Minutes  Signed: Earleen Newport, FNP-BC 03/17/2015, 3:08  PM   Patient seen, Suicide Assessment Completed.  Disposition Plan Reviewed

## 2015-03-17 NOTE — [facility] Suicide Risk Assessment (Signed)
[facility] Discharge Suicide Risk Assessment   Demographic Factors:  44 year old divorced female, lives with daughter and with ex mother in law, employed, but upcoming loss of job has been a stressor   Total Time spent with patient: 30 minutes  Musculoskeletal: Strength & Muscle Tone: within normal limits Gait & Station: normal Patient leans: N/A  Psychiatric Specialty Exam: Physical Exam  ROS  Blood pressure 120/72, pulse 90, temperature 99.3 F (37.4 C), temperature source Oral, resp. rate 20, height 5\' 4"  (1.626 m), weight 250 lb (113.399 kg), last menstrual period 02/26/2015.Body mass index is 42.89 kg/(m^2).  General Appearance: improved grooming   Eye Contact::  Good  Speech:  Normal Rate409  Volume:  Normal  Mood:  improved   Affect:  Appropriate, more reactive   Thought Process:  Goal Directed and Linear  Orientation:  Other:  fully oriented   Thought Content:  no hallucinations, no delusions , future oriented   Suicidal Thoughts:  No  Homicidal Thoughts:  No  Memory:  recent and remote grossly intact   Judgement:  Other:  improved   Insight:  improved   Psychomotor Activity:  Normal  Concentration:  Good  Recall:  Good  Fund of Knowledge:Good  Language: Good  Akathisia:  Negative  Handed:  Right  AIMS (if indicated):     Assets:  Communication Skills Desire for Improvement Physical Health  Sleep:  Number of Hours: 6.5  Cognition: WNL  ADL's: improved    Have you used any form of tobacco in the last 30 days? (Cigarettes, Smokeless Tobacco, Cigars, and/or Pipes): Yes  Has this patient used any form of tobacco in the last 30 days? (Cigarettes, Smokeless Tobacco, Cigars, and/or Pipes) Yes, A prescription for an FDA-approved tobacco cessation medication was offered at discharge and the patient refused  Mental Status Per Nursing Assessment::   On Admission:     Current Mental Status by Physician: At this time is improved compared to admission- mood partially  improved, affect more reactive, no thought disorder, no SI , no HI, no psychotic symptoms. Future oriented , for example, spoke about upcoming interview for new job.  Loss Factors: Job loss, strain at home related to ex mother in law becoming more frail  Historical Factors: History of Depression, no prior suicide attempts .   Risk Reduction Factors:   Responsible for children under 42 years of age, Sense of responsibility to family, Living with another person, especially a relative and Positive coping skills or problem solving skills  Continued Clinical Symptoms:  As noted, significant improvement compared to admission.   Cognitive Features That Contribute To Risk:  No gross cognitive deficits noted upon discharge. Is alert , attentive, and oriented x 3    Suicide Risk:  Mild:  Suicidal ideation of limited frequency, intensity, duration, and specificity.  There are no identifiable plans, no associated intent, mild dysphoria and related symptoms, good self-control (both objective and subjective assessment), few other risk factors, and identifiable protective factors, including available and accessible social support.  Principal Problem: Panic disorder with agoraphobia Discharge Diagnoses:  Patient Active Problem List   Diagnosis Date Noted  . Panic disorder with agoraphobia [F40.01] 03/15/2015  . MDD (major depressive disorder), recurrent episode, severe [F33.2] 03/14/2015  . Anxiety and depression [F41.8] 11/11/2012  . Personal history of colonic polyps [Z86.010] 11/11/2012    Follow-up Information    Follow up with [facility]. Go on 03/20/2015.   Why:  Monday, 8:30am appointment  Contact information:   [address]  [place] [address] [phone]       Plan Of Care/Follow-up recommendations:  Activity:  as tolerated  Diet:  Regular Tests:  NA Other:  see below  Is patient on multiple antipsychotic therapies at discharge:  No   Has Patient  had three or more failed trials of antipsychotic monotherapy by history:  No  Recommended Plan for Multiple Antipsychotic Therapies: NA  Patient leaving in good spirits. Plans to return home. Follow up as below.   COBOS, FERNANDO 03/17/2015, 10:54 AM

## 2015-03-17 NOTE — Plan of Care (Signed)
Problem: Alteration in mood; excessive anxiety as evidenced by: Goal: STG-Pt can identify coping skills to manage panic/anxiety (Patient can identify at least ____ coping skills to manage panic/anxiety attack)  Outcome: Progressing Client able to identify 2 coping skills AEB verbalizing "step away from the situation" "deep breathing"

## 2015-03-17 NOTE — Progress Notes (Signed)
D/C instructions/meds/follow-up appointments reviewed, pt verbalized understanding, pt's belongings returned to pt, samples given. 

## 2015-03-17 NOTE — Progress Notes (Signed)
Recreation Therapy Notes  Date: 07.22.16 Time: 9:30 am Location: [address] Group Room  Group Topic: Stress Management  Goal Area(s) Addresses:  Patient will verbalize importance of using healthy stress management.  Patient will identify positive emotions associated with healthy stress management.   Intervention: Stress Management   Activity :  Guided Automotive engineer.  LRT introduced and educated patients on stress management  Technique of guided imagery.  A script was used to deliver the technique to patients.  Patients were asked to follow script read a loud by LRT to engage in practicing the stress management technique.  Education:  Stress Management, Discharge Planning.   Education Outcome: Acknowledges edcuation/In group clarification offered/Needs additional education  Clinical Observations/Feedback: Patient did not attend group.   Victorino Sparrow, LRT/CTRS         Victorino Sparrow A 03/17/2015 3:42 PM

## 2015-03-18 LAB — URINE CULTURE: Culture: 20000

## 2015-04-26 ENCOUNTER — Other Ambulatory Visit: Payer: Self-pay

## 2015-05-08 ENCOUNTER — Other Ambulatory Visit: Payer: Self-pay

## 2015-07-17 ENCOUNTER — Emergency Department (HOSPITAL_BASED_OUTPATIENT_CLINIC_OR_DEPARTMENT_OTHER)
Admission: EM | Admit: 2015-07-17 | Discharge: 2015-07-17 | Disposition: A | Discharge status: Home / Self Care | Payer: Medicaid Other | Attending: Emergency Medicine | Admitting: Emergency Medicine

## 2015-07-17 ENCOUNTER — Emergency Department (HOSPITAL_BASED_OUTPATIENT_CLINIC_OR_DEPARTMENT_OTHER): Payer: Medicaid Other

## 2015-07-17 ENCOUNTER — Encounter (HOSPITAL_BASED_OUTPATIENT_CLINIC_OR_DEPARTMENT_OTHER): Payer: Self-pay | Admitting: *Deleted

## 2015-07-17 DIAGNOSIS — Z9851 Tubal ligation status: Secondary | ICD-10-CM | POA: Diagnosis not present

## 2015-07-17 DIAGNOSIS — Z86010 Personal history of colonic polyps: Secondary | ICD-10-CM | POA: Insufficient documentation

## 2015-07-17 DIAGNOSIS — F329 Major depressive disorder, single episode, unspecified: Secondary | ICD-10-CM | POA: Insufficient documentation

## 2015-07-17 DIAGNOSIS — K5900 Constipation, unspecified: Secondary | ICD-10-CM | POA: Diagnosis not present

## 2015-07-17 DIAGNOSIS — Z3202 Encounter for pregnancy test, result negative: Secondary | ICD-10-CM | POA: Insufficient documentation

## 2015-07-17 DIAGNOSIS — D1803 Hemangioma of intra-abdominal structures: Secondary | ICD-10-CM | POA: Diagnosis not present

## 2015-07-17 DIAGNOSIS — Z79899 Other long term (current) drug therapy: Secondary | ICD-10-CM | POA: Insufficient documentation

## 2015-07-17 DIAGNOSIS — F419 Anxiety disorder, unspecified: Secondary | ICD-10-CM | POA: Diagnosis not present

## 2015-07-17 DIAGNOSIS — Z872 Personal history of diseases of the skin and subcutaneous tissue: Secondary | ICD-10-CM | POA: Insufficient documentation

## 2015-07-17 DIAGNOSIS — R1010 Upper abdominal pain, unspecified: Secondary | ICD-10-CM | POA: Diagnosis present

## 2015-07-17 DIAGNOSIS — Z87891 Personal history of nicotine dependence: Secondary | ICD-10-CM | POA: Diagnosis not present

## 2015-07-17 LAB — COMPREHENSIVE METABOLIC PANEL
ALT: 9 U/L — ABNORMAL LOW (ref 14–54)
AST: 15 U/L (ref 15–41)
Albumin: 3.9 g/dL (ref 3.5–5.0)
Alkaline Phosphatase: 59 U/L (ref 38–126)
Anion gap: 5 (ref 5–15)
BUN: 11 mg/dL (ref 6–20)
CO2: 31 mmol/L (ref 22–32)
Calcium: 9.6 mg/dL (ref 8.9–10.3)
Chloride: 103 mmol/L (ref 101–111)
Creatinine, Ser: 0.71 mg/dL (ref 0.44–1.00)
GFR calc Af Amer: >60 mL/min (ref >60)
GFR calc non Af Amer: >60 mL/min (ref >60)
Glucose, Bld: 83 mg/dL (ref 65–99)
Potassium: 3.8 mmol/L (ref 3.5–5.1)
Sodium: 139 mmol/L (ref 135–145)
Total Bilirubin: 0.4 mg/dL (ref 0.3–1.2)
Total Protein: 7 g/dL (ref 6.5–8.1)

## 2015-07-17 LAB — CBC WITH DIFFERENTIAL/PLATELET
Basophils Absolute: 0.1 10*3/uL (ref 0.0–0.1)
Basophils Relative: 1 %
Eosinophils Absolute: 0.1 10*3/uL (ref 0.0–0.7)
Eosinophils Relative: 1 %
HCT: 38.6 % (ref 36.0–46.0)
Hemoglobin: 12 g/dL (ref 12.0–15.0)
Lymphocytes Relative: 34 %
Lymphs Abs: 2.6 10*3/uL (ref 0.7–4.0)
MCH: 27.9 pg (ref 26.0–34.0)
MCHC: 31.1 g/dL (ref 30.0–36.0)
MCV: 89.8 fL (ref 78.0–100.0)
Monocytes Absolute: 0.4 10*3/uL (ref 0.1–1.0)
Monocytes Relative: 6 %
Neutro Abs: 4.4 10*3/uL (ref 1.7–7.7)
Neutrophils Relative %: 58 %
Platelets: 320 10*3/uL (ref 150–400)
RBC: 4.3 MIL/uL (ref 3.87–5.11)
RDW: 12.9 % (ref 11.5–15.5)
WBC: 7.5 10*3/uL (ref 4.0–10.5)

## 2015-07-17 LAB — URINALYSIS, ROUTINE W REFLEX MICROSCOPIC
Bilirubin Urine: NEGATIVE
Glucose, UA: NEGATIVE mg/dL
Hgb urine dipstick: NEGATIVE
Ketones, ur: NEGATIVE mg/dL
Leukocytes, UA: NEGATIVE
Nitrite: NEGATIVE
Protein, ur: NEGATIVE mg/dL
Specific Gravity, Urine: 1.007 (ref 1.005–1.030)
pH: 7 (ref 5.0–8.0)

## 2015-07-17 LAB — PREGNANCY, URINE: Preg Test, Ur: NEGATIVE

## 2015-07-17 LAB — LIPASE, BLOOD: Lipase: 23 U/L (ref 11–51)

## 2015-07-17 MED ORDER — POLYETHYLENE GLYCOL 3350 17 GM/SCOOP PO POWD: 17.0000 g | Freq: Once | ORAL | Status: DC

## 2015-07-17 MED ORDER — ONDANSETRON HCL 4 MG/2ML IJ SOLN
4.0000 mg | Freq: Once | INTRAMUSCULAR | Status: DC
  Filled 2015-07-17: qty 2

## 2015-07-17 MED ORDER — DOCUSATE SODIUM 100 MG PO CAPS: 100.0000 mg | ORAL_CAPSULE | Freq: Two times a day (BID) | ORAL | Status: DC

## 2015-07-17 MED ORDER — IOHEXOL 300 MG/ML  SOLN
25.0000 mL | Freq: Once | INTRAMUSCULAR | Status: AC | PRN
  Administered 2015-07-17: 25 mL via ORAL

## 2015-07-17 MED ORDER — MORPHINE SULFATE (PF) 4 MG/ML IV SOLN
4.0000 mg | Freq: Once | INTRAVENOUS | Status: DC
  Filled 2015-07-17: qty 1

## 2015-07-17 MED ORDER — IOHEXOL 300 MG/ML  SOLN
100.0000 mL | Freq: Once | INTRAMUSCULAR | Status: AC | PRN
  Administered 2015-07-17: 100 mL via INTRAVENOUS

## 2015-07-17 NOTE — ED Notes (Signed)
Pt in Ultrasound at this time- will medicate pt upon return

## 2015-07-17 NOTE — ED Notes (Signed)
Pt c/o right flank and abd pain and nausea since last week. She saw her PCP who told her he didn't think it was a kidney stone but may be gallbladder. He didn't do any tests, only gave her pain meds that aren't helping. She sts the pain is worse at night and radiates across the front of her abd and into her back.

## 2015-07-17 NOTE — ED Notes (Signed)
Pt is driving. Declined Morphine. States she does not need Nausea medication at this time. Will reassess

## 2015-07-17 NOTE — ED Notes (Signed)
Pt directed to pharmacy to pick up medications 

## 2015-07-17 NOTE — ED Provider Notes (Signed)
CSN: [id number]     Arrival date & time 07/17/15  0827 History   First MD Initiated Contact with Patient 07/17/15 [phone]     Chief Complaint  Patient presents with  . Abdominal Pain     (Consider location/radiation/quality/duration/timing/severity/associated sxs/prior Treatment) HPI  44 year old female who presents with abdominal pain. Reports pain upper abdominal and right flank pain that radiates down. History of tubal ligation and cesarean section. Pain usually occurs during the night times and woke her up from sleep this morning at 4AM. Pain has been on and off since three weeks ago. Not usually associated with food. Has nausea with pain, but no diarrhea or vomiting. Last bowel movement 3-4 days ago, but passing gas. No abdominal distension. No fevers or chills. No pain or burning with urination. No significant frequency. Saw PCP Tuesday, and given pain medications for possible kidney stones, but reports urinalysis unreliable at that time due to menses.  Past Medical History  Diagnosis Date  . Anxiety   . Psoriasis   . History of colonic polyps     BENIGN  . Depression    Past Surgical History  Procedure Laterality Date  . Cesarean section  1995 & 2000    X2  . Tubal ligation  2007  . Tubal reversal  2008  . Colposcopy    . Anterior cruciate ligament repair  2011   Family History  Problem Relation Age of Onset  . Hypertension Mother   . Cancer Father 46    COLON  . Diabetes Brother   . Heart attack Brother     1/2 brother-maternal   Social History  Substance Use Topics  . Smoking status: Former Smoker -- 0.25 packs/day for 20 years    Types: Cigarettes  . Smokeless tobacco: Former Systems developer    Quit date: 11/12/2010  . Alcohol Use: Yes     Comment: 6 pack on Friday night, 6 pack on Saturday night   OB History    Gravida Para Term Preterm AB TAB SAB Ectopic Multiple Living   3 2   1  1   2      Review of Systems 10/14 systems reviewed and are negative other than those  stated in the HPI\   Allergies  Review of patient's allergies indicates no known allergies.  Home Medications   Prior to Admission medications   Medication Sig Start Date End Date Taking? Authorizing Provider  albuterol (PROVENTIL HFA;VENTOLIN HFA) 108 (90 BASE) MCG/ACT inhaler Inhale 2 puffs into the lungs every 4 (four) hours as needed for wheezing or shortness of breath. 03/17/15  Yes Shuvon B Rankin, NP  citalopram (CELEXA) 20 MG tablet Take 1 tablet (20 mg total) by mouth daily. 03/17/15  Yes Shuvon B Rankin, NP  HYDROcodone-acetaminophen (NORCO/VICODIN) 5-325 MG tablet Take 1 tablet by mouth every 6 (six) hours as needed for moderate pain.   Yes Historical Provider, MD  hydrOXYzine (ATARAX/VISTARIL) 25 MG tablet Take 1 tablet (25 mg total) by mouth 3 (three) times daily as needed for anxiety. 03/17/15  Yes Shuvon B Rankin, NP  Multiple Vitamins-Calcium (ONE-A-DAY WOMENS FORMULA PO) Take by mouth.   Yes Historical Provider, MD  traMADol (ULTRAM) 50 MG tablet Take by mouth every 6 (six) hours as needed.   Yes Historical Provider, MD  docusate sodium (COLACE) 100 MG capsule Take 1 capsule (100 mg total) by mouth every 12 (twelve) hours. 07/17/15   Forde Dandy, MD  polyethylene glycol powder [facility]) powder Take 17  g by mouth once. Dissolve one capful powder into any liquid and drink once a day. You can increase to 2 times a day if unable to have a bowel movement in 3-5 days. 07/17/15   Forde Dandy, MD  traZODone (DESYREL) 50 MG tablet Take 1 tablet (50 mg total) by mouth at bedtime as needed for sleep. 03/17/15   Shuvon B Rankin, NP   BP 115/78 mmHg  Pulse 79  Temp(Src) 98.5 F (36.9 C) (Oral)  Resp 18  Ht 5\' 4"  (1.626 m)  Wt 265 lb (120.203 kg)  BMI 45.46 kg/m2  SpO2 98%  LMP 07/08/2015 Physical Exam Physical Exam  Nursing note and vitals reviewed. Constitutional: Well developed, well nourished, non-toxic, and in no acute distress Head: Normocephalic and atraumatic.   Mouth/Throat: Oropharynx is clear and moist.  Neck: Normal range of motion. Neck supple.  Cardiovascular: Normal rate and regular rhythm.   Pulmonary/Chest: Effort normal and breath sounds normal.  Abdominal: Soft. There is no tenderness. There is no rebound and no guarding.  Musculoskeletal: Normal range of motion.  Neurological: Alert, no facial droop, fluent speech, moves all extremities symmetrically Skin: Skin is warm and dry.  Psychiatric: Cooperative  ED Course  Procedures (including critical care time) Labs Review Labs Reviewed  COMPREHENSIVE METABOLIC PANEL - Abnormal; Notable for the following:    ALT 9 (*)    All other components within normal limits  URINALYSIS, ROUTINE W REFLEX MICROSCOPIC (NOT AT [facility])  PREGNANCY, URINE  CBC WITH DIFFERENTIAL/PLATELET  LIPASE, BLOOD    Imaging Review US Abdomen Complete  07/17/2015  CLINICAL DATA:  44 year old female with upper back pain and nausea for the past 3-4 weeks. EXAM: ULTRASOUND ABDOMEN COMPLETE COMPARISON:  No priors. FINDINGS: Gallbladder: No gallstones or wall thickening visualized. No sonographic Murphy sign noted. Common bile duct: Diameter: 4 mm. Liver: There are 2 hyperechoic lesions (no associated posterior acoustic shadowing or enhancement) in the right lobe of the liver, largest of which measures 2.7 x 2.2 x 2.4 cm. No other larger more suspicious appearing focal lesion identified. Within normal limits in parenchymal echogenicity. IVC: No abnormality visualized. Pancreas: Visualized portion unremarkable. Spleen: Size and appearance within normal limits.  7.4 cm in length. Right Kidney: Length: 10.9 cm. Echogenicity within normal limits. No mass or hydronephrosis visualized. Left Kidney: Length: 11.6 cm. Echogenicity within normal limits. No mass or hydronephrosis visualized. Abdominal aorta: No aneurysm visualized. Other findings: None. IMPRESSION: 1. No acute findings. 2. 2 hyperechoic lesions in the liver are  incompletely characterized on today's examination. These demonstrate no definite posterior acoustic shadowing or enhancement, and may simply represent areas of focal fatty infiltration. However, further characterization with nonemergent MRI of the abdomen with and without IV gadolinium is recommended in the near future for definitive evaluation. Electronically Signed   By: Vinnie Langton M.D.   On: 07/17/2015 10:53   Ct Abdomen Pelvis W Contrast  07/17/2015  CLINICAL DATA:  Abdominal pain, nausea for 4 weeks EXAM: CT ABDOMEN AND PELVIS WITH CONTRAST TECHNIQUE: Multidetector CT imaging of the abdomen and pelvis was performed using the standard protocol following bolus administration of intravenous contrast. CONTRAST:  182mL OMNIPAQUE IOHEXOL 300 MG/ML SOLN, 64mL OMNIPAQUE IOHEXOL 300 MG/ML SOLN COMPARISON:  Ultrasound of the abdomen same day FINDINGS: Lung bases are unremarkable. Sagittal images of the spine shows disc space flattening at L5-S1 level. Enhanced liver shows no biliary ductal dilatation. There is a hemangioma inferior aspect of the right hepatic dome measures 2.5 cm. Probable  hemangioma right hepatic lobe laterally measures 1 cm. No calcified gallstones are noted within gallbladder. The pancreas, spleen and adrenal glands are unremarkable. Moderate stool noted in right colon and transverse colon. There is no pericecal inflammation. Normal appendix is noted in coronal image 48. The terminal ileum is unremarkable. No small bowel obstruction. No ascites or free air. No adenopathy. Enhanced uterus and ovaries are unremarkable. No adnexal mass. The urinary bladder is unremarkable. No distal colonic obstruction. There is no inguinal adenopathy. No destructive bony lesions are noted within pelvis. Kidneys are symmetrical in size and enhancement. No hydronephrosis or hydroureter. Delayed renal images shows bilateral renal symmetrical excretion. IMPRESSION: 1. There is no acute inflammatory process within  abdomen or pelvis. 2. Probable hemangioma inferior aspect of the right hepatic lobe measures 2.5 cm. 3. No hydronephrosis or hydroureter. 4. Moderate stool noted in right colon and transverse colon. No small bowel or colonic obstruction. Normal appendix. No pericecal inflammation. 5. No small bowel obstruction. Electronically Signed   By: Lahoma Crocker M.D.   On: 07/17/2015 12:29   I have personally reviewed and evaluated these images and lab results as part of my medical decision-making.    MDM   Final diagnoses:  Upper abdominal pain  Constipation, unspecified constipation type  Liver hemangioma    44 year old female who presents with 3 weeks intermittent abdominal pain, worse and persistent since this morning. Well appearing on arrival. VS non-concerning. Abdomen soft and non-peritoneal. Upper abdominal tenderness worse in RUQ noted. Blood work including CBC, CMP, lipase, pregnancy and UA unremarkable. [place] neg for kidney stones or gallstones. Persistent abdominal pain per pt despite supportive management. Thus, CT performed and visualized. Liver hemangioma noted but not causing her symptoms. Significant stool burden in right side of colon and transverse colon where she has pain. Discussed treatment with miralax and colace. No other acute intra-abdominal processes noted. Strict return and follow-up instructions reviewed. She expressed understanding of all discharge instructions and felt comfortable with the plan of care.     Forde Dandy, MD 07/17/15 1946

## 2015-07-17 NOTE — Discharge Instructions (Signed)
Your CT scan did not show anything acute today. It does show incidental finding that you have a liver hemangioma. This is not causing your symptoms today, and he should follow-up with her primary care doctor about this as needed. Please take prescriptions as prescribed for constipation. Return without fail for worsening symptoms, including worsening pain, fever, vomiting unable to keep down food or fluids or any other symptoms concerning to you.  Abdominal Pain, Adult Many things can cause belly (abdominal) pain. Most times, the belly pain is not dangerous. Many cases of belly pain can be watched and treated at home. HOME CARE   Do not take medicines that help you go poop (laxatives) unless told to by your doctor.  Only take medicine as told by your doctor.  Eat or drink as told by your doctor. Your doctor will tell you if you should be on a special diet. GET HELP IF:  You do not know what is causing your belly pain.  You have belly pain while you are sick to your stomach (nauseous) or have runny poop (diarrhea).  You have pain while you pee or poop.  Your belly pain wakes you up at night.  You have belly pain that gets worse or better when you eat.  You have belly pain that gets worse when you eat fatty foods.  You have a fever. GET HELP RIGHT AWAY IF:   The pain does not go away within 2 hours.  You keep throwing up (vomiting).  The pain changes and is only in the right or left part of the belly.  You have bloody or tarry looking poop. MAKE SURE YOU:   Understand these instructions.  Will watch your condition.  Will get help right away if you are not doing well or get worse.   This information is not intended to replace advice given to you by your health care provider. Make sure you discuss any questions you have with your health care provider.   Document Released: 01/29/2008 Document Revised: 09/02/2014 Document Reviewed: 04/21/2013 Elsevier Interactive Patient  Education [employer].

## 2015-07-17 NOTE — ED Notes (Signed)
Patient transported to CT 

## 2016-02-10 ENCOUNTER — Emergency Department (HOSPITAL_BASED_OUTPATIENT_CLINIC_OR_DEPARTMENT_OTHER): Payer: Medicaid Other

## 2016-02-10 ENCOUNTER — Other Ambulatory Visit: Payer: Self-pay

## 2016-02-10 ENCOUNTER — Emergency Department (HOSPITAL_BASED_OUTPATIENT_CLINIC_OR_DEPARTMENT_OTHER)
Admission: EM | Admit: 2016-02-10 | Discharge: 2016-02-10 | Disposition: A | Discharge status: Home / Self Care | Payer: Medicaid Other | Attending: Emergency Medicine | Admitting: Emergency Medicine

## 2016-02-10 ENCOUNTER — Encounter (HOSPITAL_BASED_OUTPATIENT_CLINIC_OR_DEPARTMENT_OTHER): Payer: Self-pay | Admitting: Emergency Medicine

## 2016-02-10 DIAGNOSIS — F329 Major depressive disorder, single episode, unspecified: Secondary | ICD-10-CM | POA: Insufficient documentation

## 2016-02-10 DIAGNOSIS — Z87891 Personal history of nicotine dependence: Secondary | ICD-10-CM | POA: Insufficient documentation

## 2016-02-10 DIAGNOSIS — R002 Palpitations: Secondary | ICD-10-CM | POA: Diagnosis present

## 2016-02-10 DIAGNOSIS — Z79899 Other long term (current) drug therapy: Secondary | ICD-10-CM | POA: Diagnosis not present

## 2016-02-10 DIAGNOSIS — I493 Ventricular premature depolarization: Secondary | ICD-10-CM | POA: Insufficient documentation

## 2016-02-10 LAB — BASIC METABOLIC PANEL
Anion gap: 9 (ref 5–15)
BUN: 12 mg/dL (ref 6–20)
CO2: 23 mmol/L (ref 22–32)
Calcium: 8.8 mg/dL — ABNORMAL LOW (ref 8.9–10.3)
Chloride: 105 mmol/L (ref 101–111)
Creatinine, Ser: 0.7 mg/dL (ref 0.44–1.00)
GFR calc Af Amer: >60 mL/min (ref >60)
GFR calc non Af Amer: >60 mL/min (ref >60)
Glucose, Bld: 100 mg/dL — ABNORMAL HIGH (ref 65–99)
Potassium: 3.4 mmol/L — ABNORMAL LOW (ref 3.5–5.1)
Sodium: 137 mmol/L (ref 135–145)

## 2016-02-10 LAB — CBC
HCT: 36.9 % (ref 36.0–46.0)
Hemoglobin: 11.7 g/dL — ABNORMAL LOW (ref 12.0–15.0)
MCH: 27.9 pg (ref 26.0–34.0)
MCHC: 31.7 g/dL (ref 30.0–36.0)
MCV: 88.1 fL (ref 78.0–100.0)
Platelets: 311 10*3/uL (ref 150–400)
RBC: 4.19 MIL/uL (ref 3.87–5.11)
RDW: 13.5 % (ref 11.5–15.5)
WBC: 7.7 10*3/uL (ref 4.0–10.5)

## 2016-02-10 LAB — TROPONIN I: Troponin I: <0.03 ng/mL (ref <0.031)

## 2016-02-10 LAB — D-DIMER, QUANTITATIVE: D-Dimer, Quant: 0.33 ug/mL-FEU (ref 0.00–0.50)

## 2016-02-10 LAB — MAGNESIUM: Magnesium: 1.8 mg/dL (ref 1.7–2.4)

## 2016-02-10 MED ORDER — SODIUM CHLORIDE 0.9 % IV SOLN: INTRAVENOUS | Status: DC

## 2016-02-10 MED ORDER — SODIUM CHLORIDE 0.9 % IV BOLUS (SEPSIS)
1000.0000 mL | Freq: Once | INTRAVENOUS | Status: AC
  Administered 2016-02-10: 1000 mL via INTRAVENOUS

## 2016-02-10 MED ORDER — METOPROLOL SUCCINATE ER 50 MG PO TB24: 50.0000 mg | ORAL_TABLET | Freq: Every day | ORAL | Status: DC

## 2016-02-10 NOTE — ED Notes (Signed)
Pt reports palpitations, dizziness, weakness and h/a since yesterday, pt reports she cannot get a deep breath and sometimes can not "get my words out right." Pt alert and oriented. Pt denies cp.

## 2016-02-10 NOTE — ED Notes (Signed)
Pt made aware to return if symptoms worsen or if any life threatening symptoms occur.   

## 2016-02-10 NOTE — Discharge Instructions (Signed)
Premature Ventricular Contraction A premature ventricular contraction is an irregularity in the normal heart rhythm. These contractions are extra heartbeats that occur too early in the normal sequence. In most cases, these contractions are harmless and do not require treatment. CAUSES Premature ventricular contractions may occur without a known cause. In healthy people, the extra contractions may be caused by:  Smoking.  Drinking alcohol.  Caffeine.  Certain medicines.  Some illegal drugs.  Stress. Sometimes, changes in chemicals in the blood (electrolytes) can also cause premature ventricular contractions. They can also occur in people with heart diseases that cause a decrease in blood flow to the heart. SIGNS AND SYMPTOMS Premature ventricular contractions often do not cause any symptoms. In some cases, you may have a feeling of your heart beating fast or skipping a beat (palpitations). DIAGNOSIS Your health care provider will take your medical history and do a physical exam. During the exam, the health care provider will check for irregular heartbeats. Various tests may be done to help diagnose premature ventricular contractions. These tests may include:  An ECG (electrocardiogram) to monitor the electrical activity of your heart.  Holter monitor testing. A Holter monitor is a portable device that can monitor the electrical activity of your heart over longer periods of time.  Stress tests to see how exercise affects your heart rhythm.  Echocardiogram. This test uses sound waves (ultrasound) to produce an image of your heart.  Electrophysiology study. This is used to evaluate the electrical conduction system of your heart. TREATMENT Usually, no treatment is needed. You may be advised to avoid things that can trigger the premature contractions, such as caffeine or alcohol. Medicines are sometimes given if symptoms are severe or if the extra heartbeats are very frequent. Treatment may  also be needed for an underlying cause of the contractions if one is found. HOME CARE INSTRUCTIONS  Take medicines only as directed by your health care provider.  Make any lifestyle changes recommended by your health care provider. These may include:  Quitting smoking.  Avoiding or limiting caffeine or alcohol.  Exercising. Talk to your health care provider about what type of exercise is safe for you.  Trying to reduce stress.  Keep all follow-up visits with your health care provider. This is important. SEEK IMMEDIATE MEDICAL CARE IF:  You feel palpitations that are frequent or continual.  You have chest pain.  You have shortness of breath.  You have sweating for no reason.  You have nausea and vomiting.  You become light-headed or faint.   This information is not intended to replace advice given to you by your health care provider. Make sure you discuss any questions you have with your health care provider.   Document Released: 03/29/2004 Document Revised: 09/02/2014 Document Reviewed: 01/13/2014 Elsevier Interactive Patient Education 2016 Elsevier Inc.  

## 2016-02-10 NOTE — ED Provider Notes (Signed)
CSN: [id number]     Arrival date & time 02/10/16  1955 History  By signing my name below, I, Altamease Oiler, attest that this documentation has been prepared under the direction and in the presence of Dorie Rank, MD. Electronically Signed: Altamease Oiler, ED Scribe. 02/10/2016. 8:44 PM  Chief Complaint  Patient presents with  . Palpitations   The history is provided by the patient. No language interpreter was used.   Denzil Manuella Ghazi is a 45 y.o. female with PMHx of anxiety who presents to the Emergency Department complaining of intermittent irregular palpitations with onset yesterday. She states that she sometimes has palpitations but the episode have been more frequent since yesterday.  Associated symptoms include lightheadedness, headache, and SOB. Pt denies chest pain, fever, and LE swelling. No history of DVT/PE.    Past Medical History  Diagnosis Date  . Anxiety   . Psoriasis   . History of colonic polyps     BENIGN  . Depression    Past Surgical History  Procedure Laterality Date  . Cesarean section  1995 & 2000    X2  . Tubal ligation  2007  . Tubal reversal  2008  . Colposcopy    . Anterior cruciate ligament repair  2011   Family History  Problem Relation Age of Onset  . Hypertension Mother   . Cancer Father 33    COLON  . Diabetes Brother   . Heart attack Brother     1/2 brother-maternal   Social History  Substance Use Topics  . Smoking status: Former Smoker -- 0.25 packs/day for 20 years    Types: Cigarettes  . Smokeless tobacco: Former Systems developer    Quit date: 11/12/2010  . Alcohol Use: Yes     Comment: occ    OB History    Gravida Para Term Preterm AB TAB SAB Ectopic Multiple Living   3 2   1  1   2      Review of Systems  Constitutional: Negative for fever.  Respiratory: Positive for shortness of breath.   Cardiovascular: Positive for palpitations. Negative for chest pain and leg swelling.  Neurological: Positive for light-headedness.  All other  systems reviewed and are negative.  Allergies  Review of patient's allergies indicates no known allergies.  Home Medications   Prior to Admission medications   Medication Sig Start Date End Date Taking? Authorizing Provider  albuterol (PROVENTIL HFA;VENTOLIN HFA) 108 (90 BASE) MCG/ACT inhaler Inhale 2 puffs into the lungs every 4 (four) hours as needed for wheezing or shortness of breath. 03/17/15  Yes Shuvon B Rankin, NP  busPIRone (BUSPAR) 10 MG tablet Take 10 mg by mouth 3 (three) times daily.   Yes Historical Provider, MD  Multiple Vitamins-Calcium (ONE-A-DAY WOMENS FORMULA PO) Take by mouth.   Yes Historical Provider, MD  traMADol (ULTRAM) 50 MG tablet Take by mouth every 6 (six) hours as needed.   Yes Historical Provider, MD  traZODone (DESYREL) 50 MG tablet Take 1 tablet (50 mg total) by mouth at bedtime as needed for sleep. 03/17/15  Yes Shuvon B Rankin, NP  venlafaxine (EFFEXOR) 100 MG tablet Take 150 mg by mouth once.   Yes Historical Provider, MD  citalopram (CELEXA) 20 MG tablet Take 1 tablet (20 mg total) by mouth daily. 03/17/15   Shuvon B Rankin, NP  docusate sodium (COLACE) 100 MG capsule Take 1 capsule (100 mg total) by mouth every 12 (twelve) hours. 07/17/15   Forde Dandy, MD  HYDROcodone-acetaminophen (NORCO/VICODIN)  5-325 MG tablet Take 1 tablet by mouth every 6 (six) hours as needed for moderate pain.    Historical Provider, MD  hydrOXYzine (ATARAX/VISTARIL) 25 MG tablet Take 1 tablet (25 mg total) by mouth 3 (three) times daily as needed for anxiety. 03/17/15   Shuvon B Rankin, NP  metoprolol succinate (TOPROL XL) 50 MG 24 hr tablet Take 1 tablet (50 mg total) by mouth daily. Take with or immediately following a meal. 02/10/16   Dorie Rank, MD  polyethylene glycol powder (GLYCOLAX/MIRALAX) powder Take 17 g by mouth once. Dissolve one capful powder into any liquid and drink once a day. You can increase to 2 times a day if unable to have a bowel movement in 3-5 days. 07/17/15    Forde Dandy, MD   BP 132/93 mmHg  Pulse 108  Temp(Src) 98.2 F (36.8 C) (Oral)  Resp 18  Ht 5\' 5"  (1.651 m)  Wt 267 lb (121.11 kg)  BMI 44.43 kg/m2  SpO2 99%  LMP 02/09/2016 Physical Exam  Constitutional: She appears well-developed and well-nourished. No distress.  HENT:  Head: Normocephalic and atraumatic.  Right Ear: External ear normal.  Left Ear: External ear normal.  Eyes: Conjunctivae are normal. Right eye exhibits no discharge. Left eye exhibits no discharge. No scleral icterus.  Neck: Neck supple. No tracheal deviation present.  Cardiovascular: Normal rate, regular rhythm and intact distal pulses.   Occasional extrasystoles are present.  Pulmonary/Chest: Effort normal and breath sounds normal. No stridor. No respiratory distress. She has no wheezes. She has no rales.  Abdominal: Soft. Bowel sounds are normal. She exhibits no distension. There is no tenderness. There is no rebound and no guarding.  Musculoskeletal: She exhibits no edema or tenderness.  Neurological: She is alert. She has normal strength. No cranial nerve deficit (no facial droop, extraocular movements intact, no slurred speech) or sensory deficit. She exhibits normal muscle tone. She displays no seizure activity. Coordination normal.  Skin: Skin is warm and dry. No rash noted.  Psychiatric: She has a normal mood and affect.  Nursing note and vitals reviewed.   ED Course  Procedures (including critical care time) DIAGNOSTIC STUDIES: Oxygen Saturation is 99% on RA,  normal by my interpretation.    COORDINATION OF CARE: 8:36 PM Discussed treatment plan which includes lab work, CXR, EKG with pt at bedside and pt agreed to plan.  Labs Review Labs Reviewed  CBC - Abnormal; Notable for the following:    Hemoglobin 11.7 (*)    All other components within normal limits  BASIC METABOLIC PANEL - Abnormal; Notable for the following:    Potassium 3.4 (*)    Glucose, Bld 100 (*)    Calcium 8.8 (*)    All  other components within normal limits  TROPONIN I  D-DIMER, QUANTITATIVE (NOT AT [facility])  MAGNESIUM    Imaging Review Dg Chest Portable 1 View  02/10/2016  CLINICAL DATA:  Intermittent palpitations since yesterday. EXAM: PORTABLE CHEST 1 VIEW COMPARISON:  01/13/2014 FINDINGS: The cardiomediastinal contours are unchanged. No alveolar edema. No consolidation, pleural effusion, or pneumothorax. No acute osseous abnormalities are seen. IMPRESSION: No acute pulmonary process. Electronically Signed   By: Jeb Levering M.D.   On: 02/10/2016 21:04   I have personally reviewed and evaluated these images and lab results as part of my medical decision-making.   EKG Interpretation   Date/Time:  Saturday February 10 2016 20:08:32 EDT Ventricular Rate:  111 PR Interval:    QRS Duration: 106 QT  Interval:  331 QTC Calculation: 450 R Axis:   51 Text Interpretation:  Sinus tachycardia Baseline wander in lead(s) V3  Since last tracing rate faster Confirmed by Judge Duque  MD-J, Chalmers Iddings [id number]) on  02/10/2016 8:18:30 PM      MDM   Final diagnoses:  PVC (premature ventricular contraction)   ED evaluation is reassuring.  Doubt ACS, PE.  PVCs noted on the monitor while she has been in the ED.  Heart rate decreased with iv fluids.  DIscussed outpatient follow up.  Consider outpatient cardiac monitoring because of the more prolonged episodes she has had in the past.   Will rx low dose metoprolol to see if that helps with the symptomatic pvcs.  I personally performed the services described in this documentation, which was scribed in my presence.  The recorded information has been reviewed and is accurate.    Dorie Rank, MD 02/10/16 [id number]

## 2016-02-10 NOTE — ED Notes (Signed)
Pt on heart monitor 

## 2017-01-08 ENCOUNTER — Encounter: Payer: Self-pay | Admitting: Gynecology

## 2017-04-25 IMAGING — DX DG CHEST 1V PORT
1 series · 1 of 1 positions shown · non-contrast
Comparison: 01/13/2014

CLINICAL DATA: Intermittent palpitations since yesterday.

EXAM:
PORTABLE CHEST 1 VIEW

[chest ap]
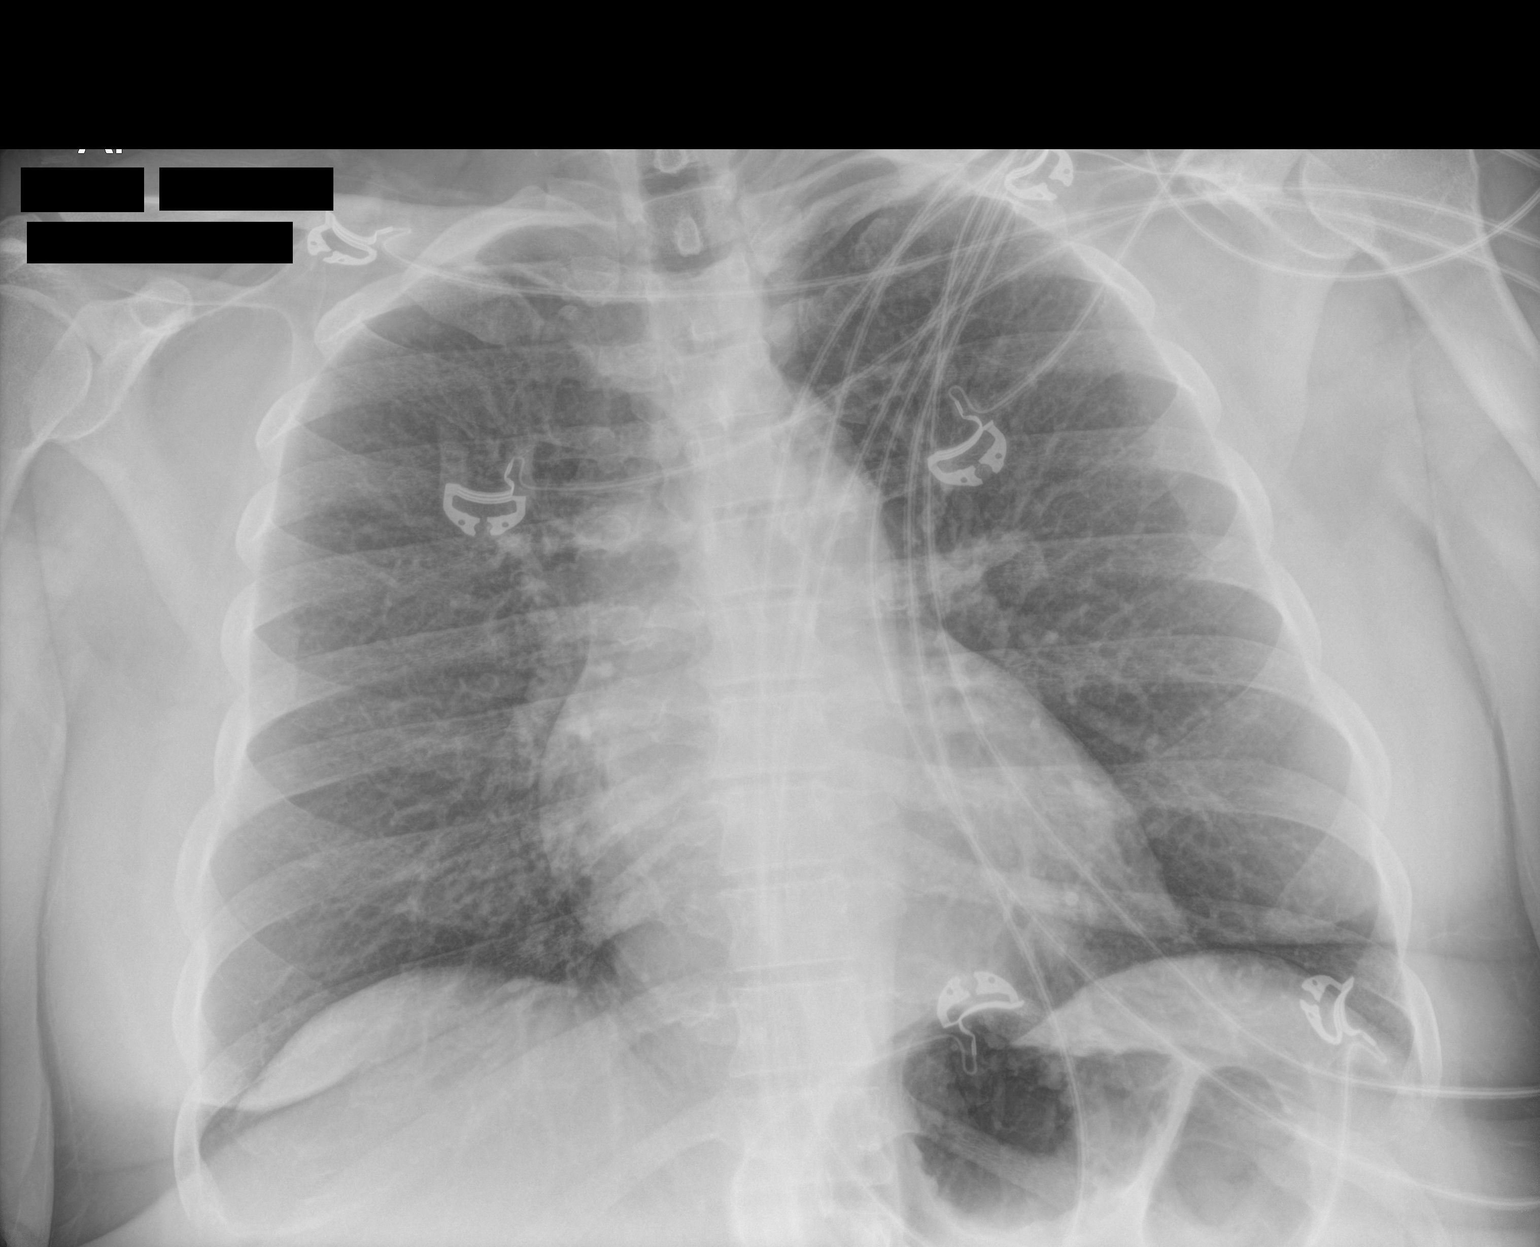

[1 of 1 positions shown; findings below may reference images not displayed]

FINDINGS: The cardiomediastinal contours are unchanged. No alveolar edema. No
consolidation, pleural effusion, or pneumothorax. No acute osseous
abnormalities are seen.
IMPRESSION: No acute pulmonary process.

## 2017-04-27 ENCOUNTER — Emergency Department (HOSPITAL_BASED_OUTPATIENT_CLINIC_OR_DEPARTMENT_OTHER)
Admission: EM | Admit: 2017-04-27 | Discharge: 2017-04-27 | Disposition: A | Discharge status: Home / Self Care | Payer: Medicaid Other | Attending: Emergency Medicine | Admitting: Emergency Medicine

## 2017-04-27 ENCOUNTER — Encounter (HOSPITAL_BASED_OUTPATIENT_CLINIC_OR_DEPARTMENT_OTHER): Payer: Self-pay | Admitting: Emergency Medicine

## 2017-04-27 DIAGNOSIS — Z79899 Other long term (current) drug therapy: Secondary | ICD-10-CM | POA: Insufficient documentation

## 2017-04-27 DIAGNOSIS — Z87891 Personal history of nicotine dependence: Secondary | ICD-10-CM | POA: Insufficient documentation

## 2017-04-27 DIAGNOSIS — I471 Supraventricular tachycardia: Secondary | ICD-10-CM | POA: Diagnosis not present

## 2017-04-27 DIAGNOSIS — R002 Palpitations: Secondary | ICD-10-CM | POA: Diagnosis present

## 2017-04-27 HISTORY — DX: Tachycardia, unspecified: R00.0

## 2017-04-27 LAB — CBC WITH DIFFERENTIAL/PLATELET
Basophils Absolute: 0.1 10*3/uL (ref 0.0–0.1)
Basophils Relative: 0 %
Eosinophils Absolute: 0.1 10*3/uL (ref 0.0–0.7)
Eosinophils Relative: 1 %
HCT: 40.4 % (ref 36.0–46.0)
Hemoglobin: 13 g/dL (ref 12.0–15.0)
Lymphocytes Relative: 28 %
Lymphs Abs: 3.1 10*3/uL (ref 0.7–4.0)
MCH: 30 pg (ref 26.0–34.0)
MCHC: 32.2 g/dL (ref 30.0–36.0)
MCV: 93.1 fL (ref 78.0–100.0)
Monocytes Absolute: 0.5 10*3/uL (ref 0.1–1.0)
Monocytes Relative: 5 %
Neutro Abs: 7.6 10*3/uL (ref 1.7–7.7)
Neutrophils Relative %: 66 %
Platelets: 341 10*3/uL (ref 150–400)
RBC: 4.34 MIL/uL (ref 3.87–5.11)
RDW: 13.1 % (ref 11.5–15.5)
WBC: 11.4 10*3/uL — ABNORMAL HIGH (ref 4.0–10.5)

## 2017-04-27 LAB — COMPREHENSIVE METABOLIC PANEL
ALT: 8 U/L — ABNORMAL LOW (ref 14–54)
AST: 15 U/L (ref 15–41)
Albumin: 3.8 g/dL (ref 3.5–5.0)
Alkaline Phosphatase: 57 U/L (ref 38–126)
Anion gap: 8 (ref 5–15)
BUN: 11 mg/dL (ref 6–20)
CO2: 27 mmol/L (ref 22–32)
Calcium: 9.3 mg/dL (ref 8.9–10.3)
Chloride: 106 mmol/L (ref 101–111)
Creatinine, Ser: 0.8 mg/dL (ref 0.44–1.00)
GFR calc Af Amer: >60 mL/min (ref >60)
GFR calc non Af Amer: >60 mL/min (ref >60)
Glucose, Bld: 108 mg/dL — ABNORMAL HIGH (ref 65–99)
Potassium: 4.1 mmol/L (ref 3.5–5.1)
Sodium: 141 mmol/L (ref 135–145)
Total Bilirubin: 0.3 mg/dL (ref 0.3–1.2)
Total Protein: 7.5 g/dL (ref 6.5–8.1)

## 2017-04-27 LAB — TSH: TSH: 5.788 u[IU]/mL — ABNORMAL HIGH (ref 0.350–4.500)

## 2017-04-27 MED ORDER — ACETAMINOPHEN 325 MG PO TABS
650.0000 mg | ORAL_TABLET | Freq: Once | ORAL | Status: AC
  Administered 2017-04-27: 650 mg via ORAL
  Filled 2017-04-27: qty 2

## 2017-04-27 MED ORDER — DILTIAZEM HCL 30 MG PO TABS
120.0000 mg | ORAL_TABLET | Freq: Once | ORAL | Status: AC
  Administered 2017-04-27: 120 mg via ORAL
  Filled 2017-04-27: qty 4

## 2017-04-27 MED ORDER — ADENOSINE 6 MG/2ML IV SOLN
INTRAVENOUS | Status: AC
  Filled 2017-04-27: qty 6

## 2017-04-27 NOTE — Discharge Instructions (Signed)
Continue taking your Cardizem at this time

## 2017-04-27 NOTE — ED Notes (Signed)
ED Provider at bedside. 

## 2017-04-27 NOTE — ED Provider Notes (Signed)
[place] [facility] Provider Note   CSN: [id number] Arrival date & time: 04/27/17  1522     History   Chief Complaint Chief Complaint  Patient presents with  . Palpitations    HPI Sydney Glover is a 46 y.o. female.  Patient is a 46 year old female with a history of anxiety, depression and prior episodes of tachycardia for which she saw cardiologist but was cleared and no cause was found who presents today for rapid heart rate. Patient states she was folding clothes at home and felt her heart racing. Patient states she initially sat down and tried to relax but her heart rate did not improve. At home her heart rate was in the 200s. She denies excessive caffeine use, no stimulants or diet pills and does not take any herbal medications. She felt her normal self prior to this starting. Because her symptoms did not improve she came to emergency room for further evaluation.   The history is provided by the patient.  Palpitations   This is a recurrent problem. The current episode started less than 1 hour ago. The problem occurs constantly. The problem has not changed since onset.The problem is associated with an unknown factor. On average, each episode lasts 1 hour. Associated symptoms include diaphoresis, malaise/fatigue and shortness of breath. Pertinent negatives include no chest pain, no chest pressure, no near-syncope, no syncope, no lower extremity edema and no dizziness. She has tried deep relaxation for the symptoms. The treatment provided no relief. There are no known risk factors.    Past Medical History:  Diagnosis Date  . Anxiety   . Depression   . History of colonic polyps    BENIGN  . Psoriasis   . Tachycardia     Patient Active Problem List   Diagnosis Date Noted  . Major depressive disorder, recurrent, severe without psychotic features ([place])   . Panic disorder with agoraphobia 03/15/2015  . MDD (major depressive disorder), recurrent episode, severe ([place])  03/14/2015  . Anxiety and depression 11/11/2012  . Personal history of colonic polyps 11/11/2012    Past Surgical History:  Procedure Laterality Date  . ANTERIOR CRUCIATE LIGAMENT REPAIR  2011  . [place]   X2  . COLPOSCOPY    . TUBAL LIGATION  2007  . TUBAL REVERSAL  2008    OB History    Gravida Para Term Preterm AB Living   3 2     1 2    SAB TAB Ectopic Multiple Live Births   1               Home Medications    Prior to Admission medications   Medication Sig Start Date End Date Taking? Authorizing Provider  diltiazem (TIAZAC) 180 MG 24 hr capsule Take 180 mg by mouth daily.   Yes [provider]  albuterol (PROVENTIL HFA;VENTOLIN HFA) 108 (90 BASE) MCG/ACT inhaler Inhale 2 puffs into the lungs every 4 (four) hours as needed for wheezing or shortness of breath. 03/17/15   Rankin, Shuvon B, NP  busPIRone (BUSPAR) 10 MG tablet Take 10 mg by mouth 3 (three) times daily.    [provider]  citalopram (CELEXA) 20 MG tablet Take 1 tablet (20 mg total) by mouth daily. 03/17/15   Rankin, Shuvon B, NP  docusate sodium (COLACE) 100 MG capsule Take 1 capsule (100 mg total) by mouth every 12 (twelve) hours. 07/17/15   Forde Dandy, MD  HYDROcodone-acetaminophen (NORCO/VICODIN) 5-325 MG tablet Take  1 tablet by mouth every 6 (six) hours as needed for moderate pain.    [provider]  hydrOXYzine (ATARAX/VISTARIL) 25 MG tablet Take 1 tablet (25 mg total) by mouth 3 (three) times daily as needed for anxiety. 03/17/15   Rankin, Shuvon B, NP  metoprolol succinate (TOPROL XL) 50 MG 24 hr tablet Take 1 tablet (50 mg total) by mouth daily. Take with or immediately following a meal. 02/10/16   Dorie Rank, MD  Multiple Vitamins-Calcium (ONE-A-DAY WOMENS FORMULA PO) Take by mouth.    [provider]  polyethylene glycol powder (GLYCOLAX/MIRALAX) powder Take 17 g by mouth once. Dissolve one capful powder into any liquid and drink once a day. You  can increase to 2 times a day if unable to have a bowel movement in 3-5 days. 07/17/15   Forde Dandy, MD  traMADol Veatrice Bourbon) 50 MG tablet Take by mouth every 6 (six) hours as needed.    [provider]  traZODone (DESYREL) 50 MG tablet Take 1 tablet (50 mg total) by mouth at bedtime as needed for sleep. 03/17/15   Rankin, Shuvon B, NP  venlafaxine (EFFEXOR) 100 MG tablet Take 150 mg by mouth once.    [provider]    Family History Family History  Problem Relation Age of Onset  . Hypertension Mother   . Cancer Father 50       COLON  . Diabetes Brother   . Heart attack Brother        1/2 brother-maternal    Social History Social History  Substance Use Topics  . Smoking status: Former Smoker    Packs/day: 0.25    Years: 20.00    Types: Cigarettes  . Smokeless tobacco: Former Systems developer    Quit date: 11/12/2010  . Alcohol use Yes     Comment: occ      Allergies   Patient has no known allergies.   Review of Systems Review of Systems  Constitutional: Positive for diaphoresis and malaise/fatigue.  Respiratory: Positive for shortness of breath.   Cardiovascular: Positive for palpitations. Negative for chest pain, syncope and near-syncope.  Neurological: Negative for dizziness.  All other systems reviewed and are negative.    Physical Exam Updated Vital Signs BP 128/88   Pulse (!) 118   Temp 98.3 F (36.8 C) (Oral)   Resp 17   SpO2 99%   Physical Exam  Constitutional: She is oriented to person, place, and time. She appears well-developed and well-nourished.  Slightly anxious  HENT:  Head: Normocephalic and atraumatic.  Mouth/Throat: Oropharynx is clear and moist.  Eyes: Pupils are equal, round, and reactive to light. Conjunctivae and EOM are normal.  Neck: Normal range of motion. Neck supple.  Cardiovascular: Regular rhythm and intact distal pulses.  Tachycardia present.   No murmur heard. Pulmonary/Chest: Effort normal and breath sounds normal. No  respiratory distress. She has no wheezes. She has no rales.  Abdominal: Soft. She exhibits no distension. There is no tenderness. There is no rebound and no guarding.  Musculoskeletal: Normal range of motion. She exhibits no edema or tenderness.  Neurological: She is alert and oriented to person, place, and time.  Skin: Skin is warm. No rash noted. She is diaphoretic. No erythema.  Psychiatric: She has a normal mood and affect. Her behavior is normal.  Nursing note and vitals reviewed.    ED Treatments / Results  Labs (all labs ordered are listed, but only abnormal results are displayed) Labs Reviewed  CBC  WITH DIFFERENTIAL/PLATELET - Abnormal; Notable for the following:       Result Value   WBC 11.4 (*)    All other components within normal limits  COMPREHENSIVE METABOLIC PANEL - Abnormal; Notable for the following:    Glucose, Bld 108 (*)    ALT 8 (*)    All other components within normal limits  TSH  T4    EKG  EKG Interpretation  Date/Time:  Sunday April 27 2017 15:39:06 EDT Ventricular Rate:  126 PR Interval:    QRS Duration: 97 QT Interval:  293 QTC Calculation: 425 R Axis:   12 Text Interpretation:  Sinus tachycardia Low voltage, precordial leads RSR' in V1 or V2, right VCD or RVH Baseline wander in lead(s) V3 V4 Supraventricular tachycardia RESOLVED SINCE PREVIOUS Confirmed by Blanchie Dessert [phone]) on 04/27/2017 3:48:02 PM       Radiology No results found.  Procedures Procedures (including critical care time)  Medications Ordered in ED Medications  adenosine (ADENOCARD) 6 MG/2ML injection (not administered)     Initial Impression / Assessment and Plan / ED Course  I have reviewed the triage vital signs and the nursing notes.  Pertinent labs & imaging results that were available during my care of the patient were reviewed by me and considered in my medical decision making (see chart for details).     Patient with a history of palpitations but  no definitive diagnosis presenting today with rapid heart rate. On EKG patient found to be in SVT with a rate of 220. After 2 vagal maneuvers rate was aborted to a sinus tachycardia in the 120s. Patient did not require chemical cardioversion. She had recently gone back on Cardizem and had taken a dose this morning around 9:00. She was given an additional dose of 120 mg of Cardizem. Patient has not recently had her thyroid checked in TSH and T4 are pending. CBC and BMP are negative for acute findings. Repeat EKG showed sinus tachycardia without other significant findings. We'll have patient follow-up with her cardiologist. Will discuss with cardiology whether they want her to increase her Cardizem or maintain on the same dose.  5:44 PM Labs are reassuring. TSH and T4 are pending. Heart rate is improved to 100. Patient feels much better. Recommended the patient continue her Cardizem and she is only been back on it for 1 week. Also recommended calling Dr. Everitt Amber office on Tuesday for follow-up appointment. Counseled in avoiding caffeine or stimulants.  Final Clinical Impressions(s) / ED Diagnoses   Final diagnoses:  SVT (supraventricular tachycardia) ([place])    New Prescriptions New Prescriptions   No medications on file     Blanchie Dessert, MD 04/27/17 1746

## 2017-04-27 NOTE — ED Triage Notes (Signed)
Pt reports while watching TV, HR went up to 215. Denies chest pain.

## 2017-04-27 NOTE — ED Notes (Signed)
Pt reports palpitations, and his having mild nausea, and is anxious. Pt noted to have warm, clammy skin and is pale. Lung sounds clear & =.

## 2017-04-27 NOTE — ED Notes (Signed)
EDP having pt perform vagal maneuvers at this time. Pt HR slowing down to ST at 117.

## 2017-04-27 NOTE — ED Notes (Signed)
Pt reports that her symptoms of nausea, anxiousness and palpitations have resolved at this time.

## 2017-04-29 LAB — T4: T4, Total: 6.9 ug/dL (ref 4.5–12.0)

## 2017-04-30 ENCOUNTER — Ambulatory Visit (INDEPENDENT_AMBULATORY_CARE_PROVIDER_SITE_OTHER): Payer: Medicaid Other | Admitting: Women's Health

## 2017-04-30 ENCOUNTER — Encounter: Payer: Self-pay | Admitting: Women's Health

## 2017-04-30 VITALS — BP 122/80 | Ht 64.0 in | Wt 255.0 lb

## 2017-04-30 DIAGNOSIS — B9689 Other specified bacterial agents as the cause of diseases classified elsewhere: Secondary | ICD-10-CM

## 2017-04-30 DIAGNOSIS — N943 Premenstrual tension syndrome: Secondary | ICD-10-CM

## 2017-04-30 DIAGNOSIS — Z113 Encounter for screening for infections with a predominantly sexual mode of transmission: Secondary | ICD-10-CM | POA: Diagnosis not present

## 2017-04-30 DIAGNOSIS — Z30011 Encounter for initial prescription of contraceptive pills: Secondary | ICD-10-CM | POA: Diagnosis not present

## 2017-04-30 DIAGNOSIS — E038 Other specified hypothyroidism: Secondary | ICD-10-CM

## 2017-04-30 DIAGNOSIS — N760 Acute vaginitis: Secondary | ICD-10-CM | POA: Diagnosis not present

## 2017-04-30 DIAGNOSIS — N920 Excessive and frequent menstruation with regular cycle: Secondary | ICD-10-CM

## 2017-04-30 DIAGNOSIS — Z01419 Encounter for gynecological examination (general) (routine) without abnormal findings: Secondary | ICD-10-CM | POA: Diagnosis not present

## 2017-04-30 DIAGNOSIS — Z0000 Encounter for general adult medical examination without abnormal findings: Secondary | ICD-10-CM

## 2017-04-30 LAB — WET PREP FOR TRICH, YEAST, CLUE

## 2017-04-30 MED ORDER — METRONIDAZOLE 500 MG PO TABS: 500.0000 mg | ORAL_TABLET | Freq: Two times a day (BID) | ORAL | 0 refills | Status: DC

## 2017-04-30 MED ORDER — LEVOTHYROXINE SODIUM 50 MCG PO TABS: 50.0000 ug | ORAL_TABLET | Freq: Every day | ORAL | 0 refills | Status: DC

## 2017-04-30 MED ORDER — LO LOESTRIN FE 1 MG-10 MCG / 10 MCG PO TABS: 1.0000 | ORAL_TABLET | Freq: Every day | ORAL | 4 refills | Status: DC

## 2017-04-30 NOTE — Progress Notes (Signed)
Sydney Glover 05-29-1971 [id number]    History:    Presents for annual exam.  Monthly heavy cycle, was started on Ortho Tri-Cyclen per primary care for menorrhagia and PMS type symptoms. Hypertension, anxiety managed at primary care. 10-Dec-2005 BTL reversal did not conceive after and no longer desires conception. Having heart palpitations/SVT 04/25/2017 and does have scheduled follow-up with cardiologist, TSH 5.7, T4 normal. New partner. Normal Pap and mammogram history.  12-10-2009 benign colon polyp, Dec 11, 2015 benign colon polyp is due to repeat December 10, 2017. Father died from colon cancer.  Past medical history, past surgical history, family history and social history were all reviewed and documented in the EPIC chart. 2 daughters ages 63 and 22 both struggle with some anxiety. Mother died at age 74 from heart disease..  ROS:  A ROS was performed and pertinent positives and negatives are included.  Exam:  Vitals:   04/30/17 0951  BP: 122/80  Weight: 255 lb (115.7 kg)  Height: 5\' 4"  (1.626 m)   Body mass index is 43.77 kg/m.   General appearance:  Normal Thyroid:  Symmetrical, normal in size, without palpable masses or nodularity. Respiratory  Auscultation:  Clear without wheezing or rhonchi Cardiovascular  Auscultation:  Regular rate, without rubs, murmurs or gallops  Edema/varicosities:  Not grossly evident Abdominal  Soft,nontender, without masses, guarding or rebound.  Liver/spleen:  No organomegaly noted  Hernia:  None appreciated  Skin  Inspection:  Grossly normal   Breasts: Examined lying and sitting.     Right: Without masses, retractions, discharge or axillary adenopathy.     Left: Without masses, retractions, discharge or axillary adenopathy. Gentitourinary   Inguinal/mons:  Normal without inguinal adenopathy  External genitalia:  Normal  BUS/Urethra/Skene's glands:  Normal  Vagina: Moderate discharge with odor, wet prep positive for clues Cervix:  Normal  Uterus:   normal in  size, shape and contour.  Midline and mobile  Adnexa/parametria:     Rt: Without masses or tenderness.   Lt: Without masses or tenderness.  Anus and perineum: Normal  Digital rectal exam: Normal sphincter tone without palpated masses or tenderness  Assessment/Plan:  46 y.o. engaged WF G3 P2  for annual exam.    Monthly cycle with menorrhagia and PMS type symptoms Hypertension, anxiety and depression-primary care manages labs and meds Newly diagnosed hypothyroid and SVT. Contraception management Bacteria vaginosis Morbid obesity STD screen  Plan: Flagyl 500 twice daily for 7 days, prescription, proper use given and reviewed alcohol precautions. Reviewed risks of OCs with hypertension, reviewed best to use the lowest dose possible, will try lo Loestrin prescription, proper use given and reviewed risk for blood clots and strokes. Reviewed importance of taking daily, will call if continued heavy cycles. SBE's, continue annual screening mammogram due in September. Increase exercise and decrease calories/carbs for weight loss encouraged. Keep scheduled follow-up with cardiologist for SVT. Synthroid 50 g by mouth daily proper administration reviewed, will repeat TSH in 2 months. Pap with HR HPV typing, new screening guidelines reviewed.    [place], 11:13 AM 04/30/2017

## 2017-04-30 NOTE — Patient Instructions (Signed)

## 2017-05-01 LAB — PAP, TP IMAGING W/ HPV RNA, RFLX HPV TYPE 16,18/45: HPV DNA High Risk: NOT DETECTED

## 2017-05-01 LAB — HEPATITIS C ANTIBODY
Hepatitis C Ab: NONREACTIVE
SIGNAL TO CUT-OFF: 0.01 (ref <1.00)

## 2017-05-01 LAB — URINALYSIS W MICROSCOPIC + REFLEX CULTURE
Bacteria, UA: NONE SEEN /HPF
Bilirubin Urine: NEGATIVE
Glucose, UA: NEGATIVE
Hgb urine dipstick: NEGATIVE
Hyaline Cast: NONE SEEN /LPF
Ketones, ur: NEGATIVE
Leukocyte Esterase: NEGATIVE
Nitrites, Initial: NEGATIVE
Protein, ur: NEGATIVE
Specific Gravity, Urine: 1.022 (ref 1.001–1.03)
WBC, UA: NONE SEEN /HPF (ref 0–5)
pH: 5.5 (ref 5.0–8.0)

## 2017-05-01 LAB — C. TRACHOMATIS/N. GONORRHOEAE RNA
C. trachomatis RNA, TMA: NOT DETECTED
N. gonorrhoeae RNA, TMA: NOT DETECTED

## 2017-05-01 LAB — HEPATITIS B SURFACE ANTIGEN: Hepatitis B Surface Ag: NONREACTIVE

## 2017-05-01 LAB — RPR: RPR Ser Ql: NONREACTIVE

## 2017-05-01 LAB — HIV ANTIBODY (ROUTINE TESTING W REFLEX): HIV 1&2 Ab, 4th Generation: NONREACTIVE

## 2017-05-01 LAB — NO CULTURE INDICATED

## 2017-06-16 ENCOUNTER — Institutional Professional Consult (permissible substitution): Payer: Medicaid Other | Admitting: Neurology

## 2017-06-16 ENCOUNTER — Telehealth: Payer: Self-pay

## 2017-06-16 NOTE — Telephone Encounter (Signed)
Pt did not show for their appt with Dr. Athar today.  

## 2017-06-17 ENCOUNTER — Encounter: Payer: Self-pay | Admitting: Neurology

## 2017-07-29 ENCOUNTER — Other Ambulatory Visit: Payer: Self-pay | Admitting: Women's Health

## 2017-07-29 DIAGNOSIS — E038 Other specified hypothyroidism: Secondary | ICD-10-CM

## 2017-07-31 ENCOUNTER — Other Ambulatory Visit: Payer: Self-pay | Admitting: Women's Health

## 2017-07-31 DIAGNOSIS — E038 Other specified hypothyroidism: Secondary | ICD-10-CM

## 2017-08-01 NOTE — Telephone Encounter (Signed)
Left detailed message in voice mail per [facility] access note on file that needs TSH drawn as soon as she can to be sure she is on right dose. I told her [place] refilled them today but needs TSH recheck.

## 2017-08-01 NOTE — Telephone Encounter (Signed)
[facility] for refill but needs to get TSH checked, please have her schedule

## 2017-08-20 ENCOUNTER — Other Ambulatory Visit: Payer: Medicaid Other

## 2017-09-09 ENCOUNTER — Other Ambulatory Visit: Payer: Self-pay | Admitting: *Deleted

## 2017-09-09 DIAGNOSIS — E038 Other specified hypothyroidism: Secondary | ICD-10-CM

## 2017-09-11 ENCOUNTER — Other Ambulatory Visit: Payer: Medicaid Other

## 2017-09-11 DIAGNOSIS — E038 Other specified hypothyroidism: Secondary | ICD-10-CM

## 2017-09-11 LAB — TSH: TSH: 1.64 mIU/L

## 2017-09-14 DIAGNOSIS — R011 Cardiac murmur, unspecified: Secondary | ICD-10-CM | POA: Diagnosis present

## 2017-09-14 DIAGNOSIS — R0602 Shortness of breath: Secondary | ICD-10-CM | POA: Diagnosis present

## 2017-09-14 NOTE — H&P (Signed)
OFFICE VISIT NOTES COPIED TO EPIC FOR DOCUMENTATION  . istory of Present Illness Sydney Page MD; 09/11/2017 1:14 PM) Patient words: Last O/V 06/11/2017; 2 month f/u for HTN.  The patient is a 47 year old female who presents for shortness of breath.  Additional reasons for visit:  Follow-up for Supraventricular tachycardia is described as the following: Patient was previously evaluated in 2017 and found to have frequent PVCs. She was started on diltiazem, routine treadmill stress testing that was essentially normal, but was noted to have reduced aerobic tolerance. Echocardiogram at that time was also showed incidental possible small PFO and mild LVH, but otherwise normal.  Patient was recently seen in the emergency room on 04/27/2017 after she had an episode of heart racing while folding clothes. She had associated diaphoresis, shortness of breath, and lightheadedness. The emergency room she is noted to be in SVT at a rate of 220 bpm that successfully converted with to vagal maneuvers to sinus tachycardia at 120 bpm. I had started her on Dilt which she is tolerating. He main complaint today is marked dyspnea on exertion. No PND or orthopnea. Occasional leg swelling present. Continues to have difficulty in weight loss.   Problem List/Past Medical Sydney Page, MD; 09/11/2017 6:21 PM) PTSD (post-traumatic stress disorder) (F43.10)  Anxiety and depression (F41.9, F32.9)  Psoriasis (L40.9)  Back pain (M54.9)  Hypothyroidism (acquired) (E03.9)  SVT (supraventricular tachycardia) (I47.1)  PVC (premature ventricular contraction) (I49.3)  Treadmill exercise stress test 04/12/2016: Indications: PVC. The resting electrocardiogram demonstrated normal sinus rhythm with an IRBBB, normal resting conduction, no resting arrhythmias and normal rest repolarization. The stress electrocardiogram was normal. There were no significant arrhythmias. Patient exercised on Bruce protocol for  5:00 minutes and achieved 92% of Max Predicted HR (Target HR was >85% MPHR) and 7.03 METS. Stress symptoms included fatigue and dyspnea. Normal BP response. Exercise capacity was low. Impression: Normal stress EKG. No significant arrhythmias. Normal BP response. Reduced aerobic tolerence. Morbid obesity (E66.01)  Hyperlipidemia, mixed (E78.2)  Family history of premature CAD (Z49.49)  Mother died from complications from angioplasty at age 76; Brother had MI at age 58: Both smokers Essential hypertension (I10)  EKG 05/07/2017: Normal sinus rhythm rate of 80 bpm, normal axis, normal interval, no evidence of ischemia. Normal EKG. Compared to EKG 7/71/2017, no changes. History of colonic polyps (Z86.010)  Multiple. Also has F/Y of polyps in mother Labwork  Labs 07/24/2017: Serum glucose 85 mg, BUN 11, creatinine 0.79, eGFR 91/105 mL, potassium 4.3. Labs 07/03/2017: Serum glucose 87 mg, BUN 11, creatinine 0.82, potassium 5.4. Total cholesterol 210, triglycerides 180, HDL 60, LDL 114. Lab 04/27/2017: BUN 11, creatinine 0.80, potassium 4.4, EGFR greater than 60 mL, TSH minimally elevated at 5.788. Normal T4. CBC normal. 02/10/2016: Magnesium 1.8, creatinine 0.70, potassium 3.4 01/19/2015: TSH 3.21, total cholesterol 248, triglycerides 255, HDL 62, LDL 135, non-HDL 186, creatinine 0.6, potassium 4.3, CMP normal, CBC normal  Allergies (April Garrison; 09/11/2017 12:13 PM) No Known Drug Allergies [03/25/2016]:  Family History (April Garrison; 09/11/2017 12:13 PM) Siblings  Pt has a total of 8 half siblings all of who are in stable health with no cardiovascular conditions. 1 brother was listed in family history Brother 1  In stable health. 81 yrs older; MI at age 39, no prior heart attacks , CAD but no other known cardiovascular conditions Mother  Deceased. at age 17 from angioplasty complications; no heart attacks or major strokes, several TIAs, CAD, HTN, but no other cardiovascular conditions Father  Deceased. at age 45 from colon cancer; no heart attacks or strokes, no cardiovascular conditions  Social History (April Garrison; 09/11/2017 12:19 PM) Alcohol Use  Occasional alcohol use. Current tobacco use  Former smoker. smoked for about 25 years, 1/2- 1 ppd Living Situation  Lives with spouse. Marital status  Married. Number of Children  2.  Past Surgical History (April Garrison; 09/11/2017 12:13 PM) Cesarean Delivery [2000]: x2; first one 1995 Tubal Ligation [2007]: Tubal Reversal [2007]: ACL Reconstruction [2010]:  Medication History (April Louretta Shorten; 09/11/2017 12:24 PM) Lisinopril-Hydrochlorothiazide (20-25MG Tablet, 1 (one) Tablet Oral daily, Taken starting 07/07/2017) Active. DilTIAZem HCl ER Beads (300MG Capsule ER 24HR, 1 (one) Capsule Oral daily, Taken starting 06/11/2017) Active. DilTIAZem HCl (30MG Tablet, 1 (one) Tablet Tablet Oral as needed for breakthrough SVT, Taken starting 05/07/2017) Active. Venlafaxine HCl ER (225MG Tablet ER 24HR, 1 Oral daily) Active. TraMADol HCl (50MG Tablet, 2 Oral two times daily) Active. BusPIRone HCl (15MG Tablet, 1 Oral two times daily) Active. Levothyroxine Sodium (50MCG Tablet, 1 Oral daily) Active. Iron (Ferrous Gluconate) (1 Oral occasionally) Specific strength unknown - Active. Biotin (1000MCG Tablet Chewable, 1 Oral daily) Active. Lo Loestrin Fe (1 MG-10 MCG /10 MCG Tablet, 1 Oral daily) Active. ProAir HFA (108 (90 Base)MCG/ACT Aerosol Soln, Inhalation as needed) Active. HydrOXYzine HCl (25MG Tablet, 1 Oral as needed) Active. Medications Reconciled (verbally with pt)  Diagnostic Studies History Sydney Page, MD; 09/11/2017 6:20 PM) Treadmill stress test [04/12/2016]: Indications: PVC. The resting electrocardiogram demonstrated normal sinus rhythm with an IRBBB, normal resting conduction, no resting arrhythmias and normal rest repolarization. The stress electrocardiogram was normal. There were no  significant arrhythmias. Patient exercised on Bruce protocol for 5:00 minutes and achieved 92% of Max Predicted HR (Target HR was >85% MPHR) and 7.03 METS. Stress symptoms included fatigue and dyspnea. Normal BP response. Exercise capacity was low. Impression: Normal stress EKG. No significant arrhythmias. Normal BP response. Reduced aerobic tolerence. Echocardiogram [04/09/2016]: 04/09/2016: Left ventricle cavity is normal in size. Mild concentric hypertrophy of the left ventricle. Normal global wall motion. Normal diastolic filling pattern. Calculated EF 60%. Left atrial cavity is normal in size. Interatrial septum is aneurysmal with a possible small patent foramen ovale is present. Trace mitral regurgitation. Trace tricuspid regurgitation. Unable to estimate PA pressure due to absence/minimal TR signal.  Other Problems Sydney Page, MD; 09/11/2017 6:21 PM) Echocardiogram [04/09/2016]: Left ventricle cavity is normal in size. Mild concentric hypertrophy of the left ventricle. Normal global wall motion. Normal diastolic filling pattern. Calculated EF 60%. Left atrial cavity is normal in size. Interatrial septum is aneurysmal with a possible small patent foramen ovale is present. Trace mitral regurgitation. Trace tricuspid regurgitation. Unable to estimate PA pressure due to absence/minimal TR signal.    Review of Systems Sydney Page MD; 09/11/2017 1:12 PM) General Not Present- Anorexia, Fatigue and Fever. Respiratory Present- Decreased Exercise Tolerance. Not Present- Cough and Dyspnea. Cardiovascular Present- Difficulty Breathing On Exertion and Palpitations. Not Present- Chest Pain, Claudications, Edema, Orthopnea and Paroxysmal Nocturnal Dyspnea. Gastrointestinal Not Present- Black, Tarry Stool, Change in Bowel Habits and Nausea. Neurological Not Present- Focal Neurological Symptoms and Syncope. Endocrine Not Present- Cold Intolerance, Excessive Sweating, Heat Intolerance and  Thyroid Problems. Hematology Not Present- Anemia, Easy Bruising, Petechiae and Prolonged Bleeding. All other systems negative  Vitals (April Garrison; 09/11/2017 12:29 PM) 09/11/2017 12:15 PM Weight: 260.06 lb Height: 64in Body Surface Area: 2.19 m Body Mass Index: 44.64 kg/m  Pulse: 107 (Regular)  P.OX: 98% (Room air) BP: 122/72 (  Sitting, Left Arm, Standard)       Physical Exam Sydney Page MD; 09/11/2017 1:12 PM) General Mental Status-Alert. General Appearance-Cooperative, Appears stated age, Not in acute distress. Build & Nutrition-Moderately built and Morbidly obese.  Head and Neck Neck -Note: Short neck and difficult to evaluate JVD.  Thyroid Gland Characteristics - no palpable nodules, no palpable enlargement.  Cardiovascular Inspection Jugular vein - Right - No Distention. Auscultation Rhythm - Regular. Heart Sounds - Ejection systolic click present. Murmurs & Other Heart Sounds: Murmur - Location - Pulmonic Area. Timing - Early systolic. Grade - III/VI. Character - Crescendo. Radiation - Left carotid.  Abdomen Inspection Contour - Obese and Pannus present. Palpation/Percussion Normal exam - Non Tender and No hepatosplenomegaly. Auscultation Normal exam - Bowel sounds normal.  Peripheral Vascular Lower Extremity Inspection - Bilateral - Inspection Normal. Palpation - Edema - Bilateral - Trace edema. Femoral pulse - Bilateral - Feeble(Pulsus difficult to feel due to patient's bodily habitus.), No Bruits. Popliteal pulse - Bilateral - Feeble(Pulsus difficult to feel due to patient's bodily habitus.). Dorsalis pedis pulse - Bilateral - Normal. Posterior tibial pulse - Bilateral - Normal. Carotid arteries - Bilateral-No Carotid bruit.  Neurologic Neurologic evaluation reveals -alert and oriented x 3 with no impairment of recent or remote memory. Motor-Grossly intact without any focal deficits.  Musculoskeletal Global  Assessment Left Lower Extremity - normal range of motion without pain. Right Lower Extremity - normal range of motion without pain.    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 Class 3 severe obesity due to excess calories without serious comorbidity with body mass index (BMI) of 40.0 to 44.9 in adult (E66.01) Dyspnea on exertion (R06.09) Story: Echocardiogram 04/09/2016: Left ventricle cavity is normal in size. Mild concentric hypertrophy of the left ventricle. Normal global wall motion. Normal diastolic filling pattern. Calculated EF 60%. Left atrial cavity is normal in size. Interatrial septum is aneurysmal with a possible small patent foramen ovale is present. Trace mitral regurgitation. Trace tricuspid regurgitation. Unable to estimate PA pressure due to absence/minimal TR signal. Hyperlipidemia, mixed (E78.2) Family history of premature CAD (Z82.49) Story: Mother died from complications from angioplasty at age 40; Brother had MI at age 39: Both smokers Essential hypertension (I10) Story: EKG 05/07/2017: Normal sinus rhythm rate of 80 bpm, normal axis, normal interval, no evidence of ischemia. Normal EKG. Compared to EKG 7/71/2017, no changes. Labwork Story: Labs 07/24/2017: Serum glucose 85 mg, BUN 11, creatinine 0.79, eGFR 91/105 mL, potassium 4.3.  Labs 07/03/2017: Serum glucose 87 mg, BUN 11, creatinine 0.82, potassium 5.4. Total cholesterol 210, triglycerides 180, HDL 60, LDL 114.  Lab 04/27/2017: BUN 11, creatinine 0.80, potassium 4.4, EGFR greater than 60 mL, TSH minimally elevated at 5.788. Normal T4. CBC normal.  02/10/2016: Magnesium 1.8, creatinine 0.70, potassium 3.4  01/19/2015: TSH 3.21, total cholesterol 248, triglycerides 255, HDL 62, LDL 135, non-HDL 186, creatinine 0.6, potassium 4.3, CMP normal, CBC normal  Note:. Recommendation:  This is a 45-monthoffice visit  and follow-up of her PSVT, since increasing the dose of diltiazem, she has not had any further episodes of palpitations except one brief episode for a few seconds. She is aware of Valsalva maneuver.  I have reviewed the results of the recently performed labs, she continues to have mixed hyperlipidemia and I suspect this is due to her lifestyle and excessive calorie intake. I believe she make lifestyle changes, she may not  need any pharmacologic therapy.  She has noticed marked worsening dyspnea over the past few months. Also on auscultation, she appears to have either pulmonary stenosis with a grade 3/6 systolic ejection murmur and an ejection click versus atrial septal defect. TEE would be more appropriate to evaluate cardiac structures in view of her morbid obesity and continued symptoms of dyspnea and abnormal physical exam.  Blood pressure remains well controlled. She complains of constipation that is chronic, advised her to use Metamucil on a daily basis. 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  CC Dr. Kathryne Eriksson.    Signed by Sydney Page, MD (09/11/2017 6:22 PM)

## 2017-09-19 ENCOUNTER — Encounter (HOSPITAL_COMMUNITY)
Admission: RE | Disposition: A | Discharge status: Home / Self Care | Payer: Self-pay | Source: Ambulatory Visit | Attending: Cardiology

## 2017-09-19 ENCOUNTER — Ambulatory Visit (HOSPITAL_COMMUNITY)
Admission: RE | Admit: 2017-09-19 | Discharge: 2017-09-19 | Disposition: A | Discharge status: Home / Self Care | Payer: Medicaid Other | Source: Ambulatory Visit | Attending: Cardiology | Admitting: Cardiology

## 2017-09-19 ENCOUNTER — Other Ambulatory Visit: Payer: Self-pay

## 2017-09-19 DIAGNOSIS — R0602 Shortness of breath: Secondary | ICD-10-CM | POA: Diagnosis present

## 2017-09-19 DIAGNOSIS — E782 Mixed hyperlipidemia: Secondary | ICD-10-CM | POA: Insufficient documentation

## 2017-09-19 DIAGNOSIS — Z79899 Other long term (current) drug therapy: Secondary | ICD-10-CM | POA: Insufficient documentation

## 2017-09-19 DIAGNOSIS — I471 Supraventricular tachycardia: Secondary | ICD-10-CM | POA: Insufficient documentation

## 2017-09-19 DIAGNOSIS — Z793 Long term (current) use of hormonal contraceptives: Secondary | ICD-10-CM | POA: Insufficient documentation

## 2017-09-19 DIAGNOSIS — Z87891 Personal history of nicotine dependence: Secondary | ICD-10-CM | POA: Insufficient documentation

## 2017-09-19 DIAGNOSIS — R011 Cardiac murmur, unspecified: Secondary | ICD-10-CM | POA: Diagnosis present

## 2017-09-19 DIAGNOSIS — Z7951 Long term (current) use of inhaled steroids: Secondary | ICD-10-CM | POA: Insufficient documentation

## 2017-09-19 DIAGNOSIS — Z6841 Body Mass Index (BMI) 40.0 and over, adult: Secondary | ICD-10-CM | POA: Insufficient documentation

## 2017-09-19 DIAGNOSIS — R0609 Other forms of dyspnea: Secondary | ICD-10-CM | POA: Insufficient documentation

## 2017-09-19 DIAGNOSIS — Z9889 Other specified postprocedural states: Secondary | ICD-10-CM | POA: Diagnosis not present

## 2017-09-19 DIAGNOSIS — Z8249 Family history of ischemic heart disease and other diseases of the circulatory system: Secondary | ICD-10-CM | POA: Insufficient documentation

## 2017-09-19 HISTORY — PX: TEE WITHOUT CARDIOVERSION: SHX5443

## 2017-09-19 SURGERY — ECHOCARDIOGRAM, TRANSESOPHAGEAL
Anesthesia: Moderate Sedation

## 2017-09-19 MED ORDER — DIPHENHYDRAMINE HCL 50 MG/ML IJ SOLN
INTRAMUSCULAR | Status: AC
  Filled 2017-09-19: qty 1

## 2017-09-19 MED ORDER — MIDAZOLAM HCL 5 MG/ML IJ SOLN
INTRAMUSCULAR | Status: AC
  Filled 2017-09-19: qty 2

## 2017-09-19 MED ORDER — ONDANSETRON HCL 4 MG/2ML IJ SOLN
INTRAMUSCULAR | Status: AC
  Filled 2017-09-19: qty 2

## 2017-09-19 MED ORDER — SODIUM CHLORIDE 0.9 % IV SOLN
INTRAVENOUS | Status: DC
  Administered 2017-09-19: 09:00:00 via INTRAVENOUS

## 2017-09-19 MED ORDER — DIPHENHYDRAMINE HCL 50 MG/ML IJ SOLN
INTRAMUSCULAR | Status: DC | PRN
  Administered 2017-09-19: 25 mg via INTRAVENOUS

## 2017-09-19 MED ORDER — MIDAZOLAM HCL 10 MG/2ML IJ SOLN
INTRAMUSCULAR | Status: DC | PRN
  Administered 2017-09-19: 3 mg via INTRAVENOUS
  Administered 2017-09-19 (×3): 2 mg via INTRAVENOUS
  Administered 2017-09-19: 1 mg via INTRAVENOUS

## 2017-09-19 MED ORDER — BUTAMBEN-TETRACAINE-BENZOCAINE 2-2-14 % EX AERO
INHALATION_SPRAY | CUTANEOUS | Status: DC | PRN
  Administered 2017-09-19: 3 via TOPICAL

## 2017-09-19 MED ORDER — FENTANYL CITRATE (PF) 100 MCG/2ML IJ SOLN
INTRAMUSCULAR | Status: AC
  Filled 2017-09-19: qty 2

## 2017-09-19 MED ORDER — ONDANSETRON HCL 4 MG/2ML IJ SOLN
4.0000 mg | Freq: Once | INTRAMUSCULAR | Status: AC
  Administered 2017-09-19: 4 mg via INTRAVENOUS

## 2017-09-19 MED ORDER — FENTANYL CITRATE (PF) 100 MCG/2ML IJ SOLN
INTRAMUSCULAR | Status: DC | PRN
  Administered 2017-09-19 (×4): 25 ug via INTRAVENOUS

## 2017-09-19 NOTE — Discharge Instructions (Signed)
TEE   YOU HAD AN CARDIAC PROCEDURE TODAY: Refer to the procedure report and other information in the discharge instructions given to you for any specific questions about what was found during the examination. If this information does not answer your questions, please call Dr. Irven Shelling office at [phone] to clarify.   DIET: Your first meal following the procedure should be a light meal and then it is ok to progress to your normal diet. A half-sandwich or bowl of soup is an example of a good first meal. Heavy or fried foods are harder to digest and may make you feel nauseous or bloated. Drink plenty of fluids but you should avoid alcoholic beverages for 24 hours. If you had a esophageal dilation, please see attached instructions for diet.   ACTIVITY: Your care partner should take you home directly after the procedure. You should plan to take it easy, moving slowly for the rest of the day. You can resume normal activity the day after the procedure however YOU SHOULD NOT DRIVE, use power tools, machinery or perform tasks that involve climbing or major physical exertion for 24 hours (because of the sedation medicines used during the test).   SYMPTOMS TO REPORT IMMEDIATELY: A cardiologist can be reached at any hour. Please call [phone] for any of the following symptoms:  Vomiting of blood or coffee ground material  New, significant abdominal pain  New, significant chest pain or pain under the shoulder blades  Painful or persistently difficult swallowing  New shortness of breath  Black, tarry-looking or red, bloody stools  FOLLOW UP:  Please also call with any specific questions about appointments or follow up tests. Moderate Conscious Sedation, Adult, Care After These instructions provide you with information about caring for yourself after your procedure. Your health care provider may also give you more specific instructions. Your treatment has been planned according to current medical  practices, but problems sometimes occur. Call your health care provider if you have any problems or questions after your procedure. What can I expect after the procedure? After your procedure, it is common:  To feel sleepy for several hours.  To feel clumsy and have poor balance for several hours.  To have poor judgment for several hours.  To vomit if you eat too soon.  Follow these instructions at home: For at least 24 hours after the procedure:   Do not: ? Participate in activities where you could fall or become injured. ? Drive. ? Use heavy machinery. ? Drink alcohol. ? Take sleeping pills or medicines that cause drowsiness. ? Make important decisions or sign legal documents. ? Take care of children on your own.  Rest. Eating and drinking  Follow the diet recommended by your health care provider.  If you vomit: ? Drink water, juice, or soup when you can drink without vomiting. ? Make sure you have little or no nausea before eating solid foods. General instructions  Have a responsible adult stay with you until you are awake and alert.  Take over-the-counter and prescription medicines only as told by your health care provider.  If you smoke, do not smoke without supervision.  Keep all follow-up visits as told by your health care provider. This is important. Contact a health care provider if:  You keep feeling nauseous or you keep vomiting.  You feel light-headed.  You develop a rash.  You have a fever. Get help right away if:  You have trouble breathing. This information is not intended to replace advice given  to you by your health care provider. Make sure you discuss any questions you have with your health care provider. Document Released: 06/02/2013 Document Revised: 01/15/2016 Document Reviewed: 12/02/2015 Elsevier Interactive Patient Education  [employer].

## 2017-09-19 NOTE — Interval H&P Note (Signed)
History and Physical Interval Note:  09/19/2017 10:05 AM  Sydney Glover Sydney Glover  has presented today for surgery, with the diagnosis of ASD PALPITATIONS  The various methods of treatment have been discussed with the patient and family. After consideration of risks, benefits and other options for treatment, the patient has consented to  Procedure(s): TRANSESOPHAGEAL ECHOCARDIOGRAM (TEE) (N/A) as a surgical intervention .  The patient's history has been reviewed, patient examined, no change in status, stable for surgery.  I have reviewed the patient's chart and labs.  Questions were answered to the patient's satisfaction.     Adrian Prows

## 2017-09-19 NOTE — Progress Notes (Signed)
  Echocardiogram 2D Echocardiogram has been performed.  Sydney Glover F 09/19/2017, 1:44 PM

## 2017-09-19 NOTE — CV Procedure (Signed)
TEE: Under moderate sedation, TEE was performed without complications: LV: Normal. Normal EF. RV: Normal LA: Normal. Left atrial appendage: Normal without thrombus. Normal function. Inter atrial septum is intact without defect. Aneurysmal. Double contrast study negative for atrial level shunting. 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 RA: Normal  MV: Normal Trace MR. TV: Normal Trace TR AV: Normal. No AI or AS. PV: Normal. Trace PI.  Thoracic and ascending aorta: Normal without significant plaque or atheromatous changes.  Conscious sedation protocol was followed, I personally administered conscious sedation and monitored the patient. Patient received 10 milligrams of Versed and 100 and 25 mg Benadryl  . Patient tolerated the procedure well and there was no complication from conscious sedation. Time administered was 28 minutes.

## 2017-09-21 ENCOUNTER — Encounter (HOSPITAL_COMMUNITY): Payer: Self-pay | Admitting: Cardiology

## 2017-10-27 ENCOUNTER — Other Ambulatory Visit: Payer: Self-pay

## 2017-10-27 ENCOUNTER — Other Ambulatory Visit: Payer: Self-pay | Admitting: Women's Health

## 2017-10-27 DIAGNOSIS — E038 Other specified hypothyroidism: Secondary | ICD-10-CM

## 2017-10-27 DIAGNOSIS — E0590 Thyrotoxicosis, unspecified without thyrotoxic crisis or storm: Secondary | ICD-10-CM

## 2017-10-27 DIAGNOSIS — Z30011 Encounter for initial prescription of contraceptive pills: Secondary | ICD-10-CM

## 2017-10-27 MED ORDER — LO LOESTRIN FE 1 MG-10 MCG / 10 MCG PO TABS: 1.0000 | ORAL_TABLET | Freq: Every day | ORAL | 2 refills | Status: DC

## 2017-10-27 NOTE — Telephone Encounter (Signed)
I called patient and per DPR access note on file I left detailed message in voice mail. I told her to call for lab appt. Order placed.

## 2017-10-27 NOTE — Telephone Encounter (Signed)
Okay for refill, recheck TSH in the next 2 months.

## 2017-11-21 DIAGNOSIS — R42 Dizziness and giddiness: Secondary | ICD-10-CM | POA: Diagnosis not present

## 2017-11-21 DIAGNOSIS — R6889 Other general symptoms and signs: Secondary | ICD-10-CM | POA: Diagnosis not present

## 2017-12-04 DIAGNOSIS — E039 Hypothyroidism, unspecified: Secondary | ICD-10-CM | POA: Diagnosis not present

## 2017-12-04 DIAGNOSIS — I10 Essential (primary) hypertension: Secondary | ICD-10-CM | POA: Diagnosis not present

## 2018-02-05 ENCOUNTER — Ambulatory Visit (HOSPITAL_COMMUNITY): Payer: Medicaid Other | Admitting: Psychiatry

## 2018-02-05 ENCOUNTER — Encounter

## 2018-03-26 ENCOUNTER — Telehealth: Payer: Self-pay

## 2018-03-26 MED ORDER — SULFAMETHOXAZOLE-TRIMETHOPRIM 800-160 MG PO TABS: 1.0000 | ORAL_TABLET | Freq: Two times a day (BID) | ORAL | 0 refills | Status: DC

## 2018-03-26 NOTE — Telephone Encounter (Signed)
Left message in voice mail that Dr. Loetta Rough agreed to send Rx. If sx persist will need OV to check urine.

## 2018-03-26 NOTE — Telephone Encounter (Signed)
Patient of [facility]'s. You are backup MD.  Sydney Glover stating she is experiencing UTI symptoms and states she is familiar with the sx.  She said she is in class all this week and cannot come in and wondered if she could get Rx sent to pharmacy.  r

## 2018-03-26 NOTE — Telephone Encounter (Signed)
Septra DS 1 p.o. twice daily x3 days 

## 2018-05-18 DIAGNOSIS — M545 Low back pain: Secondary | ICD-10-CM | POA: Diagnosis not present

## 2018-05-18 DIAGNOSIS — M25551 Pain in right hip: Secondary | ICD-10-CM | POA: Diagnosis not present

## 2018-06-15 DIAGNOSIS — H53452 Other localized visual field defect, left eye: Secondary | ICD-10-CM | POA: Diagnosis not present

## 2018-06-23 ENCOUNTER — Encounter: Payer: Self-pay | Admitting: Women's Health

## 2018-06-23 ENCOUNTER — Ambulatory Visit (INDEPENDENT_AMBULATORY_CARE_PROVIDER_SITE_OTHER): Payer: 59 | Admitting: Women's Health

## 2018-06-23 VITALS — BP 124/80 | Wt 239.0 lb

## 2018-06-23 DIAGNOSIS — Z113 Encounter for screening for infections with a predominantly sexual mode of transmission: Secondary | ICD-10-CM

## 2018-06-23 DIAGNOSIS — Z30011 Encounter for initial prescription of contraceptive pills: Secondary | ICD-10-CM

## 2018-06-23 DIAGNOSIS — N898 Other specified noninflammatory disorders of vagina: Secondary | ICD-10-CM | POA: Diagnosis not present

## 2018-06-23 DIAGNOSIS — Z01419 Encounter for gynecological examination (general) (routine) without abnormal findings: Secondary | ICD-10-CM

## 2018-06-23 DIAGNOSIS — B9689 Other specified bacterial agents as the cause of diseases classified elsewhere: Secondary | ICD-10-CM

## 2018-06-23 DIAGNOSIS — N760 Acute vaginitis: Secondary | ICD-10-CM

## 2018-06-23 LAB — WET PREP FOR TRICH, YEAST, CLUE

## 2018-06-23 MED ORDER — METRONIDAZOLE 500 MG PO TABS: 500.0000 mg | ORAL_TABLET | Freq: Two times a day (BID) | ORAL | 0 refills | Status: DC

## 2018-06-23 MED ORDER — ALPRAZOLAM 0.25 MG PO TABS: 0.2500 mg | ORAL_TABLET | Freq: Every evening | ORAL | 1 refills | Status: DC | PRN

## 2018-06-23 MED ORDER — LO LOESTRIN FE 1 MG-10 MCG / 10 MCG PO TABS: 1.0000 | ORAL_TABLET | Freq: Every day | ORAL | 2 refills | Status: DC

## 2018-06-23 NOTE — Patient Instructions (Signed)
Bacterial Vaginosis Bacterial vaginosis is an infection of the vagina. It happens when too many germs (bacteria) grow in the vagina. This infection puts you at risk for infections from sex (STIs). Treating this infection can lower your risk for some STIs. You should also treat this if you are pregnant. It can cause your baby to be born early. Follow these instructions at home: Medicines  Take over-the-counter and prescription medicines only as told by your doctor.  Take or use your antibiotic medicine as told by your doctor. Do not stop taking or using it even if you start to feel better. General instructions  If you your sexual partner is a woman, tell her that you have this infection. She needs to get treatment if she has symptoms. If you have a female partner, he does not need to be treated.  During treatment: ? Avoid sex. ? Do not douche. ? Avoid alcohol as told. ? Avoid breastfeeding as told.  Drink enough fluid to keep your pee (urine) clear or pale yellow.  Keep your vagina and butt (rectum) clean. ? Wash the area with warm water every day. ? Wipe from front to back after you use the toilet.  Keep all follow-up visits as told by your doctor. This is important. Preventing this condition  Do not douche.  Use only warm water to wash around your vagina.  Use protection when you have sex. This includes: ? Latex condoms. ? Dental dams.  Limit how many people you have sex with. It is best to only have sex with the same person (be monogamous).  Get tested for STIs. Have your partner get tested.  Wear underwear that is cotton or lined with cotton.  Avoid tight pants and pantyhose. This is most important in summer.  Do not use any products that have nicotine or tobacco in them. These include cigarettes and e-cigarettes. If you need help quitting, ask your doctor.  Do not use illegal drugs.  Limit how much alcohol you drink. Contact a doctor if:  Your symptoms do not get  better, even after you are treated.  You have more discharge or pain when you pee (urinate).  You have a fever.  You have pain in your belly (abdomen).  You have pain with sex.  Your bleed from your vagina between periods. Summary  This infection happens when too many germs (bacteria) grow in the vagina.  Treating this condition can lower your risk for some infections from sex (STIs).  You should also treat this if you are pregnant. It can cause early (premature) birth.  Do not stop taking or using your antibiotic medicine even if you start to feel better. This information is not intended to replace advice given to you by your health care provider. Make sure you discuss any questions you have with your health care provider. Document Released: 05/21/2008 Document Revised: 04/27/2016 Document Reviewed: 04/27/2016 Elsevier Interactive Patient Education  2017 [employer] Health Maintenance, Female Adopting a healthy lifestyle and getting preventive care can go a long way to promote health and wellness. Talk with your health care provider about what schedule of regular examinations is right for you. This is a good chance for you to check in with your provider about disease prevention and staying healthy. In between checkups, there are plenty of things you can do on your own. Experts have done a lot of research about which lifestyle changes and preventive measures are most likely to keep you healthy. Ask your health care   provider for more information. Weight and diet Eat a healthy diet  Be sure to include plenty of vegetables, fruits, low-fat dairy products, and lean protein.  Do not eat a lot of foods high in solid fats, added sugars, or salt.  Get regular exercise. This is one of the most important things you can do for your health. ? Most adults should exercise for at least 150 minutes each week. The exercise should increase your heart rate and make you sweat (moderate-intensity  exercise). ? Most adults should also do strengthening exercises at least twice a week. This is in addition to the moderate-intensity exercise.  Maintain a healthy weight  Body mass index (BMI) is a measurement that can be used to identify possible weight problems. It estimates body fat based on height and weight. Your health care provider can help determine your BMI and help you achieve or maintain a healthy weight.  For females 36 years of age and older: ? A BMI below 18.5 is considered underweight. ? A BMI of 18.5 to 24.9 is normal. ? A BMI of 25 to 29.9 is considered overweight. ? A BMI of 30 and above is considered obese.  Watch levels of cholesterol and blood lipids  You should start having your blood tested for lipids and cholesterol at 45 years of age, then have this test every 5 years.  You may need to have your cholesterol levels checked more often if: ? Your lipid or cholesterol levels are high. ? You are older than 45 years of age. ? You are at high risk for heart disease.  Cancer screening Lung Cancer  Lung cancer screening is recommended for adults 39-35 years old who are at high risk for lung cancer because of a history of smoking.  A yearly low-dose CT scan of the lungs is recommended for people who: ? Currently smoke. ? Have quit within the past 15 years. ? Have at least a 30-pack-year history of smoking. A pack year is smoking an average of one pack of cigarettes a day for 1 year.  Yearly screening should continue until it has been 15 years since you quit.  Yearly screening should stop if you develop a health problem that would prevent you from having lung cancer treatment.  Breast Cancer  Practice breast self-awareness. This means understanding how your breasts normally appear and feel.  It also means doing regular breast self-exams. Let your health care provider know about any changes, no matter how small.  If you are in your 20s or 30s, you should have a  clinical breast exam (CBE) by a health care provider every 1-3 years as part of a regular health exam.  If you are 55 or older, have a CBE every year. Also consider having a breast X-ray (mammogram) every year.  If you have a family history of breast cancer, talk to your health care provider about genetic screening.  If you are at high risk for breast cancer, talk to your health care provider about having an MRI and a mammogram every year.  Breast cancer gene (BRCA) assessment is recommended for women who have family members with BRCA-related cancers. BRCA-related cancers include: ? Breast. ? Ovarian. ? Tubal. ? Peritoneal cancers.  Results of the assessment will determine the need for genetic counseling and BRCA1 and BRCA2 testing.  Cervical Cancer Your health care provider may recommend that you be screened regularly for cancer of the pelvic organs (ovaries, uterus, and vagina). This screening involves a pelvic examination,  including checking for microscopic changes to the surface of your cervix (Pap test). You may be encouraged to have this screening done every 3 years, beginning at age 21.  For women ages 30-65, health care providers may recommend pelvic exams and Pap testing every 3 years, or they may recommend the Pap and pelvic exam, combined with testing for human papilloma virus (HPV), every 5 years. Some types of HPV increase your risk of cervical cancer. Testing for HPV may also be done on women of any age with unclear Pap test results.  Other health care providers may not recommend any screening for nonpregnant women who are considered low risk for pelvic cancer and who do not have symptoms. Ask your health care provider if a screening pelvic exam is right for you.  If you have had past treatment for cervical cancer or a condition that could lead to cancer, you need Pap tests and screening for cancer for at least 20 years after your treatment. If Pap tests have been discontinued,  your risk factors (such as having a new sexual partner) need to be reassessed to determine if screening should resume. Some women have medical problems that increase the chance of getting cervical cancer. In these cases, your health care provider may recommend more frequent screening and Pap tests.  Colorectal Cancer  This type of cancer can be detected and often prevented.  Routine colorectal cancer screening usually begins at 45 years of age and continues through 45 years of age.  Your health care provider may recommend screening at an earlier age if you have risk factors for colon cancer.  Your health care provider may also recommend using home test kits to check for hidden blood in the stool.  A small camera at the end of a tube can be used to examine your colon directly (sigmoidoscopy or colonoscopy). This is done to check for the earliest forms of colorectal cancer.  Routine screening usually begins at age 50.  Direct examination of the colon should be repeated every 5-10 years through 45 years of age. However, you may need to be screened more often if early forms of precancerous polyps or small growths are found.  Skin Cancer  Check your skin from head to toe regularly.  Tell your health care provider about any new moles or changes in moles, especially if there is a change in a mole's shape or color.  Also tell your health care provider if you have a mole that is larger than the size of a pencil eraser.  Always use sunscreen. Apply sunscreen liberally and repeatedly throughout the day.  Protect yourself by wearing long sleeves, pants, a wide-brimmed hat, and sunglasses whenever you are outside.  Heart disease, diabetes, and high blood pressure  High blood pressure causes heart disease and increases the risk of stroke. High blood pressure is more likely to develop in: ? People who have blood pressure in the high end of the normal range (130-139/85-89 mm Hg). ? People who are  overweight or obese. ? People who are African American.  If you are 18-39 years of age, have your blood pressure checked every 3-5 years. If you are 40 years of age or older, have your blood pressure checked every year. You should have your blood pressure measured twice-once when you are at a hospital or clinic, and once when you are not at a hospital or clinic. Record the average of the two measurements. To check your blood pressure when you are not   at a hospital or clinic, you can use: ? An automated blood pressure machine at a pharmacy. ? A home blood pressure monitor.  If you are between 59 years and 66 years old, ask your health care provider if you should take aspirin to prevent strokes.  Have regular diabetes screenings. This involves taking a blood sample to check your fasting blood sugar level. ? If you are at a normal weight and have a low risk for diabetes, have this test once every three years after 45 years of age. ? If you are overweight and have a high risk for diabetes, consider being tested at a younger age or more often. Preventing infection Hepatitis B  If you have a higher risk for hepatitis B, you should be screened for this virus. You are considered at high risk for hepatitis B if: ? You were born in a country where hepatitis B is common. Ask your health care provider which countries are considered high risk. ? Your parents were born in a high-risk country, and you have not been immunized against hepatitis B (hepatitis B vaccine). ? You have HIV or AIDS. ? You use needles to inject street drugs. ? You live with someone who has hepatitis B. ? You have had sex with someone who has hepatitis B. ? You get hemodialysis treatment. ? You take certain medicines for conditions, including cancer, organ transplantation, and autoimmune conditions.  Hepatitis C  Blood testing is recommended for: ? Everyone born from [birth year] through 1963. ? Anyone with known risk factors for  hepatitis C.  Sexually transmitted infections (STIs)  You should be screened for sexually transmitted infections (STIs) including gonorrhea and chlamydia if: ? You are sexually active and are younger than 45 years of age. ? You are older than 45 years of age and your health care provider tells you that you are at risk for this type of infection. ? Your sexual activity has changed since you were last screened and you are at an increased risk for chlamydia or gonorrhea. Ask your health care provider if you are at risk.  If you do not have HIV, but are at risk, it may be recommended that you take a prescription medicine daily to prevent HIV infection. This is called pre-exposure prophylaxis (PrEP). You are considered at risk if: ? You are sexually active and do not regularly use condoms or know the HIV status of your partner(s). ? You take drugs by injection. ? You are sexually active with a partner who has HIV.  Talk with your health care provider about whether you are at high risk of being infected with HIV. If you choose to begin PrEP, you should first be tested for HIV. You should then be tested every 3 months for as long as you are taking PrEP. Pregnancy  If you are premenopausal and you may become pregnant, ask your health care provider about preconception counseling.  If you may become pregnant, take 400 to 800 micrograms (mcg) of folic acid every day.  If you want to prevent pregnancy, talk to your health care provider about birth control (contraception). Osteoporosis and menopause  Osteoporosis is a disease in which the bones lose minerals and strength with aging. This can result in serious bone fractures. Your risk for osteoporosis can be identified using a bone density scan.  If you are 57 years of age or older, or if you are at risk for osteoporosis and fractures, ask your health care provider if you should be  screened.  Ask your health care provider whether you should take a  calcium or vitamin D supplement to lower your risk for osteoporosis.  Menopause may have certain physical symptoms and risks.  Hormone replacement therapy may reduce some of these symptoms and risks. Talk to your health care provider about whether hormone replacement therapy is right for you. Follow these instructions at home:  Schedule regular health, dental, and eye exams.  Stay current with your immunizations.  Do not use any tobacco products including cigarettes, chewing tobacco, or electronic cigarettes.  If you are pregnant, do not drink alcohol.  If you are breastfeeding, limit how much and how often you drink alcohol.  Limit alcohol intake to no more than 1 drink per day for nonpregnant women. One drink equals 12 ounces of beer, 5 ounces of wine, or 1 ounces of hard liquor.  Do not use street drugs.  Do not share needles.  Ask your health care provider for help if you need support or information about quitting drugs.  Tell your health care provider if you often feel depressed.  Tell your health care provider if you have ever been abused or do not feel safe at home. This information is not intended to replace advice given to you by your health care provider. Make sure you discuss any questions you have with your health care provider. Document Released: 02/25/2011 Document Revised: 01/18/2016 Document Reviewed: 05/16/2015 Elsevier Interactive Patient Education  2018 [employer] Inc.  

## 2018-06-23 NOTE — Progress Notes (Signed)
Sydney Glover 45-Aug-1972 [id number]    History:    Presents for annual exam.  Amenorrheic on lo Loestrin with good relief of menorrhagia/BTL.  Normal Pap and mammogram history.  New partner.  Primary care manages hypertension, anxiety/ depression and hypothyroidism.  2009-12-15 neg colon Polyps, 16-Dec-2015- colonoscopy, father died from colon cancer.  Continues to struggle with obesity, weight is down 15 pounds from last year.  Past medical history, past surgical history, family history and social history were all reviewed and documented in the EPIC chart.  Relationship has 5-year-old son Sydney Glover who she is very attached to.  Sydney Glover 21 doing well, [place] 24 is gay doing okay.  Mother died from heart disease.  ROS:  A ROS was performed and pertinent positives and negatives are included.  Exam:  Vitals:   06/23/18 1507  BP: 124/80  Weight: 239 lb (108.4 kg)   Body mass index is 41.02 kg/m.   General appearance:  Normal Thyroid:  Symmetrical, normal in size, without palpable masses or nodularity. Respiratory  Auscultation:  Clear without wheezing or rhonchi Cardiovascular  Auscultation:  Regular rate, without rubs, murmurs or gallops  Edema/varicosities:  Not grossly evident Abdominal  Soft,nontender, without masses, guarding or rebound.  Liver/spleen:  No organomegaly noted  Hernia:  None appreciated  Skin  Inspection:  Grossly normal   Breasts: Examined lying and sitting.     Right: Without masses, retractions, discharge or axillary adenopathy.     Left: Without masses, retractions, discharge or axillary adenopathy. Gentitourinary   Inguinal/mons:  Normal without inguinal adenopathy  External genitalia:  Normal  BUS/Urethra/Skene's glands:  Normal  Vagina:  Normal  Cervix:  Normal bloody discharge positive for clues, TNTC bacteria  Uterus:   normal in size, shape and contour.  Midline and mobile  Adnexa/parametria:     Rt: Without masses or tenderness.   Lt: Without masses or  tenderness.  Anus and perineum: Normal  Digital rectal exam: Normal sphincter tone without palpated masses or tenderness  Assessment/Plan:  47 y.o. D WF G3, P2 for annual exam with complaint of vaginal odor.    Amenorrheic on lo Loestrin Bacterial vaginosis Hypertension, anxiety/depression, hypothyroidism-primary care manages labs and meds Obesity  Plan: Flagyl 500 twice daily for 7 days, prescription, proper use given and reviewed.  Instructed to call if no  relief of odor.  Lo Loestrin prescription, proper use given and reviewed slight risk for blood clots and strokes, blood pressure has been stable will continue.  SBE's, continue annual screening mammogram, due instructed to schedule.  Calcium rich foods, vitamin D 1000 daily encouraged.  Continue to decrease simple carbs/calories and increase exercise for continued weight loss.  GC/chlamydia, HIV, hep B, C, RPR.  Pap normal 2016-12-15, new screening guidelines reviewed.    Huel Cote [facility], 3:43 PM 06/23/2018

## 2018-06-24 LAB — HEPATITIS B SURFACE ANTIGEN: Hepatitis B Surface Ag: NONREACTIVE

## 2018-06-24 LAB — RPR: RPR Ser Ql: NONREACTIVE

## 2018-06-24 LAB — HEPATITIS C ANTIBODY
Hepatitis C Ab: NONREACTIVE
SIGNAL TO CUT-OFF: 0.01 (ref <1.00)

## 2018-06-24 LAB — C. TRACHOMATIS/N. GONORRHOEAE RNA
C. trachomatis RNA, TMA: NOT DETECTED
N. gonorrhoeae RNA, TMA: NOT DETECTED

## 2018-06-24 LAB — HIV ANTIBODY (ROUTINE TESTING W REFLEX): HIV 1&2 Ab, 4th Generation: NONREACTIVE

## 2018-07-08 ENCOUNTER — Telehealth: Payer: Self-pay | Admitting: *Deleted

## 2018-07-08 MED ORDER — FLUCONAZOLE 150 MG PO TABS: 150.0000 mg | ORAL_TABLET | Freq: Once | ORAL | 0 refills | Status: AC

## 2018-07-08 NOTE — Telephone Encounter (Signed)
Left detailed message on cell, Rx sent. 

## 2018-07-08 NOTE — Telephone Encounter (Signed)
Patient called c/o yeast infection itching and white discharge, asked if diflucan tablet can be sent? Please advise

## 2018-07-08 NOTE — Telephone Encounter (Signed)
Ok for diflucan 150mg  one tablet OV if no relief

## 2018-09-25 ENCOUNTER — Other Ambulatory Visit: Payer: Self-pay | Admitting: Women's Health

## 2018-09-25 NOTE — Telephone Encounter (Signed)
[place] for refill, review best not to take daily

## 2018-09-28 ENCOUNTER — Ambulatory Visit: Payer: Self-pay | Admitting: Cardiology

## 2018-09-28 NOTE — Telephone Encounter (Signed)
Called into pharmacy

## 2018-10-13 ENCOUNTER — Emergency Department (HOSPITAL_COMMUNITY)
Admission: EM | Admit: 2018-10-13 | Discharge: 2018-10-14 | Disposition: A | Discharge status: Other Acute Inpatient Hospital | Payer: Self-pay | Source: Home / Self Care | Attending: Emergency Medicine | Admitting: Emergency Medicine

## 2018-10-13 ENCOUNTER — Ambulatory Visit (HOSPITAL_COMMUNITY)
Admission: AD | Admit: 2018-10-13 | Discharge: 2018-10-13 | Disposition: A | Discharge status: Home / Self Care | Payer: Federal, State, Local not specified - Other | Attending: Psychiatry | Admitting: Psychiatry

## 2018-10-13 ENCOUNTER — Encounter (HOSPITAL_COMMUNITY): Payer: Self-pay

## 2018-10-13 DIAGNOSIS — F332 Major depressive disorder, recurrent severe without psychotic features: Secondary | ICD-10-CM | POA: Insufficient documentation

## 2018-10-13 DIAGNOSIS — F419 Anxiety disorder, unspecified: Secondary | ICD-10-CM | POA: Diagnosis not present

## 2018-10-13 DIAGNOSIS — F3289 Other specified depressive episodes: Secondary | ICD-10-CM

## 2018-10-13 DIAGNOSIS — Z833 Family history of diabetes mellitus: Secondary | ICD-10-CM | POA: Insufficient documentation

## 2018-10-13 DIAGNOSIS — Z915 Personal history of self-harm: Secondary | ICD-10-CM | POA: Diagnosis not present

## 2018-10-13 DIAGNOSIS — F1290 Cannabis use, unspecified, uncomplicated: Secondary | ICD-10-CM | POA: Insufficient documentation

## 2018-10-13 DIAGNOSIS — Z800 Family history of malignant neoplasm of digestive organs: Secondary | ICD-10-CM | POA: Insufficient documentation

## 2018-10-13 DIAGNOSIS — Z87891 Personal history of nicotine dependence: Secondary | ICD-10-CM | POA: Insufficient documentation

## 2018-10-13 DIAGNOSIS — R45851 Suicidal ideations: Secondary | ICD-10-CM

## 2018-10-13 DIAGNOSIS — Z79899 Other long term (current) drug therapy: Secondary | ICD-10-CM | POA: Insufficient documentation

## 2018-10-13 LAB — ETHANOL: Alcohol, Ethyl (B): <10 mg/dL (ref <10)

## 2018-10-13 LAB — COMPREHENSIVE METABOLIC PANEL
ALT: 11 U/L (ref 0–44)
AST: 12 U/L — ABNORMAL LOW (ref 15–41)
Albumin: 4.2 g/dL (ref 3.5–5.0)
Alkaline Phosphatase: 53 U/L (ref 38–126)
Anion gap: 8 (ref 5–15)
BUN: 15 mg/dL (ref 6–20)
CO2: 25 mmol/L (ref 22–32)
Calcium: 9.6 mg/dL (ref 8.9–10.3)
Chloride: 106 mmol/L (ref 98–111)
Creatinine, Ser: 0.93 mg/dL (ref 0.44–1.00)
GFR calc Af Amer: >60 mL/min (ref >60)
GFR calc non Af Amer: >60 mL/min (ref >60)
Glucose, Bld: 109 mg/dL — ABNORMAL HIGH (ref 70–99)
Potassium: 3.9 mmol/L (ref 3.5–5.1)
Sodium: 139 mmol/L (ref 135–145)
Total Bilirubin: 0.2 mg/dL — ABNORMAL LOW (ref 0.3–1.2)
Total Protein: 7.3 g/dL (ref 6.5–8.1)

## 2018-10-13 LAB — CBC
HCT: 41.5 % (ref 36.0–46.0)
Hemoglobin: 13.1 g/dL (ref 12.0–15.0)
MCH: 30.6 pg (ref 26.0–34.0)
MCHC: 31.6 g/dL (ref 30.0–36.0)
MCV: 97 fL (ref 80.0–100.0)
Platelets: 306 10*3/uL (ref 150–400)
RBC: 4.28 MIL/uL (ref 3.87–5.11)
RDW: 12.7 % (ref 11.5–15.5)
WBC: 12 10*3/uL — ABNORMAL HIGH (ref 4.0–10.5)
nRBC: 0 % (ref 0.0–0.2)

## 2018-10-13 LAB — I-STAT BETA HCG BLOOD, ED (MC, WL, AP ONLY): I-stat hCG, quantitative: <5 m[IU]/mL (ref <5)

## 2018-10-13 LAB — ACETAMINOPHEN LEVEL: Acetaminophen (Tylenol), Serum: <10 ug/mL — ABNORMAL LOW (ref 10–30)

## 2018-10-13 LAB — SALICYLATE LEVEL: Salicylate Lvl: <7 mg/dL (ref 2.8–30.0)

## 2018-10-13 NOTE — ED Triage Notes (Signed)
Pt states she has been depressed for the last week. States she is having SI and would plan on taking a bottle of pills.
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
## 2018-10-13 NOTE — H&P (Addendum)
Behavioral Health Medical Screening Exam  Sydney Glover is an 48 y.o. female.patient presents with her daughter endorsing dependence with (tramadol) with subsequent inability to get the medication due to abuse/dependence. Moreover she endorses MDD severe, with hopelessness, and passive SI.  Total Time spent with patient: 15 minutes  Psychiatric Specialty Exam: Physical Exam  Constitutional: She is oriented to person, place, and time. She appears well-developed and well-nourished.  HENT:  Head: Normocephalic.  Eyes: Pupils are equal, round, and reactive to light.  Respiratory: Effort normal and breath sounds normal. No respiratory distress.  Neurological: She is alert and oriented to person, place, and time. No cranial nerve deficit.  Skin: Skin is warm and dry.  Psychiatric: Her speech is normal. Her mood appears anxious. She is withdrawn. Cognition and memory are impaired. She expresses impulsivity and inappropriate judgment. She exhibits a depressed mood. She expresses suicidal ideation. She expresses no homicidal ideation. She expresses no suicidal plans and no homicidal plans.    Review of Systems  Constitutional: Negative for chills, diaphoresis, fever, malaise/fatigue and weight loss.  Psychiatric/Behavioral: Positive for depression, substance abuse and suicidal ideas. Negative for hallucinations. The patient is nervous/anxious and has insomnia.   All other systems reviewed and are negative.   There were no vitals taken for this visit.There is no height or weight on file to calculate BMI.  General Appearance: Casual  Eye Contact:  Good  Speech:  Clear and Coherent  Volume:  Normal  Mood:  Depressed  Affect:  Congruent  Thought Process:  Coherent  Orientation:  Full (Time, Place, and Person)  Thought Content:  Logical  Suicidal Thoughts:  Yes.  without intent/plan  Homicidal Thoughts:  No  Memory:  Immediate;   Fair  Judgement:  Fair  Insight:  Fair  Psychomotor  Activity:  Normal  Concentration: Concentration: Fair  Recall:  [employer] of Knowledge:Fair  Language: Good  Akathisia:  Negative  Handed:  Right  AIMS (if indicated):     Assets:  Desire for Improvement  Sleep:       Musculoskeletal: Strength & Muscle Tone: within normal limits Gait & Station: normal Patient leans: N/A  There were no vitals taken for this visit.  Recommendations:  Based on my evaluation the patient appears to have an emergency medical condition for which I recommend the patient be transferred to the emergency department for further evaluation.  Laverle Hobby, PA-C 10/13/2018, 9:23 PM   Agree with NP Assessment

## 2018-10-13 NOTE — ED Notes (Signed)
Patient daughter took her personal belongings with her.

## 2018-10-14 ENCOUNTER — Inpatient Hospital Stay (HOSPITAL_COMMUNITY)
Admission: AD | Admit: 2018-10-14 | Discharge: 2018-10-17 | DRG: 885 | Disposition: A | Discharge status: Home / Self Care | Payer: 59 | Source: Intra-hospital | Attending: Psychiatry | Admitting: Psychiatry

## 2018-10-14 ENCOUNTER — Other Ambulatory Visit: Payer: Self-pay

## 2018-10-14 ENCOUNTER — Encounter (HOSPITAL_COMMUNITY): Payer: Self-pay

## 2018-10-14 DIAGNOSIS — F410 Panic disorder [episodic paroxysmal anxiety] without agoraphobia: Secondary | ICD-10-CM | POA: Diagnosis present

## 2018-10-14 DIAGNOSIS — E039 Hypothyroidism, unspecified: Secondary | ICD-10-CM | POA: Diagnosis present

## 2018-10-14 DIAGNOSIS — F17210 Nicotine dependence, cigarettes, uncomplicated: Secondary | ICD-10-CM | POA: Diagnosis present

## 2018-10-14 DIAGNOSIS — R45851 Suicidal ideations: Secondary | ICD-10-CM | POA: Diagnosis present

## 2018-10-14 DIAGNOSIS — Z79899 Other long term (current) drug therapy: Secondary | ICD-10-CM | POA: Diagnosis not present

## 2018-10-14 DIAGNOSIS — Z79891 Long term (current) use of opiate analgesic: Secondary | ICD-10-CM

## 2018-10-14 DIAGNOSIS — Z811 Family history of alcohol abuse and dependence: Secondary | ICD-10-CM

## 2018-10-14 DIAGNOSIS — G4700 Insomnia, unspecified: Secondary | ICD-10-CM | POA: Diagnosis present

## 2018-10-14 DIAGNOSIS — Z915 Personal history of self-harm: Secondary | ICD-10-CM

## 2018-10-14 DIAGNOSIS — E7800 Pure hypercholesterolemia, unspecified: Secondary | ICD-10-CM | POA: Diagnosis present

## 2018-10-14 DIAGNOSIS — F332 Major depressive disorder, recurrent severe without psychotic features: Principal | ICD-10-CM | POA: Diagnosis present

## 2018-10-14 DIAGNOSIS — Z23 Encounter for immunization: Secondary | ICD-10-CM

## 2018-10-14 DIAGNOSIS — Z818 Family history of other mental and behavioral disorders: Secondary | ICD-10-CM

## 2018-10-14 DIAGNOSIS — Z598 Other problems related to housing and economic circumstances: Secondary | ICD-10-CM

## 2018-10-14 DIAGNOSIS — Z79890 Hormone replacement therapy (postmenopausal): Secondary | ICD-10-CM

## 2018-10-14 DIAGNOSIS — F322 Major depressive disorder, single episode, severe without psychotic features: Secondary | ICD-10-CM | POA: Diagnosis not present

## 2018-10-14 DIAGNOSIS — Z560 Unemployment, unspecified: Secondary | ICD-10-CM

## 2018-10-14 DIAGNOSIS — Z888 Allergy status to other drugs, medicaments and biological substances status: Secondary | ICD-10-CM

## 2018-10-14 DIAGNOSIS — I10 Essential (primary) hypertension: Secondary | ICD-10-CM | POA: Diagnosis present

## 2018-10-14 MED ORDER — CLONIDINE HCL 0.1 MG PO TABS
0.1000 mg | ORAL_TABLET | Freq: Every day | ORAL | Status: DC
  Filled 2018-10-14 (×2): qty 1

## 2018-10-14 MED ORDER — NICOTINE 21 MG/24HR TD PT24
21.0000 mg | MEDICATED_PATCH | Freq: Every day | TRANSDERMAL | Status: DC
  Administered 2018-10-14 – 2018-10-17 (×4): 21 mg via TRANSDERMAL
  Filled 2018-10-14 (×6): qty 1

## 2018-10-14 MED ORDER — ALUM & MAG HYDROXIDE-SIMETH 200-200-20 MG/5ML PO SUSP: 30.0000 mL | ORAL | Status: DC | PRN

## 2018-10-14 MED ORDER — VENLAFAXINE HCL ER 75 MG PO CP24: 75.0000 mg | ORAL_CAPSULE | Freq: Every day | ORAL | Status: DC

## 2018-10-14 MED ORDER — CLONIDINE HCL 0.1 MG PO TABS
0.1000 mg | ORAL_TABLET | Freq: Four times a day (QID) | ORAL | Status: AC
  Administered 2018-10-14 – 2018-10-15 (×7): 0.1 mg via ORAL
  Filled 2018-10-14 (×8): qty 1

## 2018-10-14 MED ORDER — NORETHIN-ETH ESTRAD-FE BIPHAS 1 MG-10 MCG / 10 MCG PO TABS
1.0000 | ORAL_TABLET | Freq: Every day | ORAL | Status: DC
  Administered 2018-10-14 – 2018-10-17 (×4): 1 via ORAL

## 2018-10-14 MED ORDER — DILTIAZEM HCL ER COATED BEADS 240 MG PO CP24
240.0000 mg | ORAL_CAPSULE | Freq: Every day | ORAL | Status: DC
  Administered 2018-10-14 – 2018-10-17 (×4): 240 mg via ORAL
  Filled 2018-10-14 (×2): qty 1
  Filled 2018-10-14: qty 2
  Filled 2018-10-14 (×4): qty 1

## 2018-10-14 MED ORDER — LOSARTAN POTASSIUM 50 MG PO TABS
50.0000 mg | ORAL_TABLET | Freq: Every day | ORAL | Status: DC
  Administered 2018-10-14 – 2018-10-17 (×4): 50 mg via ORAL
  Filled 2018-10-14 (×7): qty 1

## 2018-10-14 MED ORDER — HYDROCHLOROTHIAZIDE 25 MG PO TABS: 25.0000 mg | ORAL_TABLET | Freq: Every day | ORAL | Status: DC

## 2018-10-14 MED ORDER — TRAMADOL HCL 50 MG PO TABS
100.0000 mg | ORAL_TABLET | Freq: Two times a day (BID) | ORAL | Status: DC
  Administered 2018-10-14: 100 mg via ORAL
  Filled 2018-10-14: qty 2

## 2018-10-14 MED ORDER — LEVOTHYROXINE SODIUM 50 MCG PO TABS
50.0000 ug | ORAL_TABLET | Freq: Every day | ORAL | Status: DC
  Filled 2018-10-14: qty 1

## 2018-10-14 MED ORDER — PNEUMOCOCCAL VAC POLYVALENT 25 MCG/0.5ML IJ INJ
0.5000 mL | INJECTION | INTRAMUSCULAR | Status: AC
  Administered 2018-10-15: 0.5 mL via INTRAMUSCULAR

## 2018-10-14 MED ORDER — TRAZODONE HCL 50 MG PO TABS
50.0000 mg | ORAL_TABLET | Freq: Every evening | ORAL | Status: DC | PRN
  Filled 2018-10-14 (×4): qty 1

## 2018-10-14 MED ORDER — LOPERAMIDE HCL 2 MG PO CAPS: 2.0000 mg | ORAL_CAPSULE | ORAL | Status: DC | PRN

## 2018-10-14 MED ORDER — METHOCARBAMOL 500 MG PO TABS: 500.0000 mg | ORAL_TABLET | Freq: Three times a day (TID) | ORAL | Status: DC | PRN

## 2018-10-14 MED ORDER — DICYCLOMINE HCL 20 MG PO TABS: 20.0000 mg | ORAL_TABLET | Freq: Four times a day (QID) | ORAL | Status: DC | PRN

## 2018-10-14 MED ORDER — HYDROXYZINE HCL 25 MG PO TABS
25.0000 mg | ORAL_TABLET | Freq: Four times a day (QID) | ORAL | Status: DC | PRN
  Administered 2018-10-14: 25 mg via ORAL
  Filled 2018-10-14: qty 1

## 2018-10-14 MED ORDER — MAGNESIUM HYDROXIDE 400 MG/5ML PO SUSP: 30.0000 mL | Freq: Every day | ORAL | Status: DC | PRN

## 2018-10-14 MED ORDER — CLONIDINE HCL 0.1 MG PO TABS
0.1000 mg | ORAL_TABLET | ORAL | Status: DC
  Administered 2018-10-16 – 2018-10-17 (×3): 0.1 mg via ORAL
  Filled 2018-10-14 (×4): qty 1

## 2018-10-14 MED ORDER — BUSPIRONE HCL 10 MG PO TABS
15.0000 mg | ORAL_TABLET | Freq: Two times a day (BID) | ORAL | Status: DC
  Administered 2018-10-14: 15 mg via ORAL
  Filled 2018-10-14: qty 2

## 2018-10-14 MED ORDER — DILTIAZEM HCL ER COATED BEADS 300 MG PO CP24: 300.0000 mg | ORAL_CAPSULE | Freq: Every day | ORAL | Status: DC

## 2018-10-14 MED ORDER — ALBUTEROL SULFATE HFA 108 (90 BASE) MCG/ACT IN AERS
2.0000 | INHALATION_SPRAY | RESPIRATORY_TRACT | Status: DC | PRN
  Administered 2018-10-14: 2 via RESPIRATORY_TRACT
  Filled 2018-10-14: qty 6.7

## 2018-10-14 MED ORDER — LEVOTHYROXINE SODIUM 50 MCG PO TABS
50.0000 ug | ORAL_TABLET | Freq: Every day | ORAL | Status: DC
  Administered 2018-10-15 – 2018-10-17 (×3): 50 ug via ORAL
  Filled 2018-10-14 (×5): qty 1

## 2018-10-14 MED ORDER — ARIPIPRAZOLE 2 MG PO TABS
2.0000 mg | ORAL_TABLET | Freq: Every day | ORAL | Status: DC
  Administered 2018-10-14 – 2018-10-17 (×4): 2 mg via ORAL
  Filled 2018-10-14 (×7): qty 1

## 2018-10-14 MED ORDER — ALPRAZOLAM 0.25 MG PO TABS
0.2500 mg | ORAL_TABLET | Freq: Every evening | ORAL | Status: DC | PRN
  Administered 2018-10-14: 0.25 mg via ORAL
  Filled 2018-10-14: qty 1

## 2018-10-14 MED ORDER — NAPROXEN 500 MG PO TABS
500.0000 mg | ORAL_TABLET | Freq: Two times a day (BID) | ORAL | Status: DC | PRN
  Administered 2018-10-14 – 2018-10-16 (×4): 500 mg via ORAL
  Filled 2018-10-14 (×4): qty 1

## 2018-10-14 MED ORDER — ACETAMINOPHEN 325 MG PO TABS
650.0000 mg | ORAL_TABLET | Freq: Four times a day (QID) | ORAL | Status: DC | PRN
  Administered 2018-10-14 – 2018-10-15 (×2): 650 mg via ORAL
  Filled 2018-10-14 (×2): qty 2

## 2018-10-14 MED ORDER — MIRTAZAPINE 7.5 MG PO TABS
7.5000 mg | ORAL_TABLET | Freq: Every day | ORAL | Status: DC
  Administered 2018-10-14 – 2018-10-15 (×2): 7.5 mg via ORAL
  Filled 2018-10-14 (×3): qty 1

## 2018-10-14 MED ORDER — ONDANSETRON 4 MG PO TBDP: 4.0000 mg | ORAL_TABLET | Freq: Four times a day (QID) | ORAL | Status: DC | PRN

## 2018-10-14 MED ORDER — VENLAFAXINE HCL ER 150 MG PO CP24
150.0000 mg | ORAL_CAPSULE | Freq: Every day | ORAL | Status: DC
  Administered 2018-10-15: 150 mg via ORAL
  Filled 2018-10-14 (×3): qty 1

## 2018-10-14 MED ORDER — INFLUENZA VAC SPLIT QUAD 0.5 ML IM SUSY
0.5000 mL | PREFILLED_SYRINGE | INTRAMUSCULAR | Status: AC
  Administered 2018-10-15: 0.5 mL via INTRAMUSCULAR
  Filled 2018-10-14: qty 0.5

## 2018-10-14 NOTE — H&P (Signed)
Psychiatric Admission Assessment Adult  Patient Identification: Sydney Glover MRN:  [id number] Date of Evaluation:  10/14/2018 Chief Complaint:  Depression Principal Diagnosis: MDD, No Psychotic Features  Diagnosis:  Active Problems:   MDD (major depressive disorder), severe (HCC)  History of Present Illness: 48 year old female, presented to hospital voluntarily at the encouragement of her daughter and friend. Reports depression and feeling hopeless, which she describes as chronic but worsening over recent weeks, and acutely aggravated by recently being fired from her job . States she had made a comment in confidence to a friend, who then informed HR , resulting in her being fired, and states she has been feeling betrayed. Reports recent passive SI, like " someone please do me a favor and kill me" but denies suicidal plan or intention recently. Endorses neuro-vegetative symptoms as below. She is prescribed Ultram and states she has been taking more than prescribed , up to 6 tablets per day . States she had been off Ultram x 4 days prior to admission, and reports she had been experiencing withdrawal symptoms prior to admission" feeling dizzy and out of it" Associated Signs/Symptoms: Depression Symptoms:  depressed mood, anhedonia, hypersomnia, suicidal thoughts without plan, anxiety, loss of energy/fatigue, (Hypo) Manic Symptoms: irritability Anxiety Symptoms: reports increased anxiety and has had recent panic attacks Psychotic Symptoms: denies  PTSD Symptoms: Does not endorse at this time Total Time spent with patient: 45 minutes  Past Psychiatric History: one prior psychiatric admission here at [facility] in July 2016. At the time was admitted for worsening depression and anxiety. Was diagnosed with MDD, Panic Disorder. At the time was discharged on Celexa and Trazodone. Reports history of a suicide attempt by overdosing on Ultram about a year ago. States she did not seek inpatient  treatment at the time.  Is the patient at risk to self? Yes.    Has the patient been a risk to self in the past 6 months? Yes.    Has the patient been a risk to self within the distant past? Yes.    Is the patient a risk to others? No.  Has the patient been a risk to others in the past 6 months? No.  Has the patient been a risk to others within the distant past? No.   Prior Inpatient Therapy:  as above  Prior Outpatient Therapy:  Psychiatric medications have been continued by her PCP.  Alcohol Screening: 1. How often do you have a drink containing alcohol?: 2 to 4 times a month 2. How many drinks containing alcohol do you have on a typical day when you are drinking?: 1 or 2 3. How often do you have six or more drinks on one occasion?: Never AUDIT-C Score: 2 9. Have you or someone else been injured as a result of your drinking?: No 10. Has a relative or friend or a doctor or another health worker been concerned about your drinking or suggested you cut down?: No Alcohol Use Disorder Identification Test Final Score (AUDIT): 2 Alcohol Brief Interventions/Follow-up: AUDIT Score <7 follow-up not indicated Substance Abuse History in the last 12 months:  Reports remote history of alcohol abuse, but states she drinks infrequently now, drinks once per month. Denies drug abuse. Reports she is prescribed Ultram/Tramadol and has been taking more than prescribed in a binge like pattern. Consequences of Substance Abuse: Does not endorse , no history of seizures, no history of DUI, no history of blackouts  Previous Psychotropic Medications: Xanax 0.25 mgrs QDAY, states was taking  irregularly, 1-2 x per week. Buspar 15 mgrs BID,  Effexor XR 325 mgrs QDAY at this time ( states she has been on this dose x 2 years ) As per chart , had been on Celexa in 2017. States " I took it only for a little while".  Psychological Evaluations:  No  Past Medical History: reports history of HTN for which she takes Losartan .   Past Medical History:  Diagnosis Date  . Anxiety   . Depression   . History of colonic polyps    BENIGN  . Psoriasis   . Tachycardia     Past Surgical History:  Procedure Laterality Date  . ANTERIOR CRUCIATE LIGAMENT REPAIR  2011  . [place]   X2  . COLPOSCOPY    . TEE WITHOUT CARDIOVERSION N/A 09/19/2017   Procedure: TRANSESOPHAGEAL ECHOCARDIOGRAM (TEE);  Surgeon: Adrian Prows, MD;  Location: [facility];  Service: Cardiovascular;  Laterality: N/A;  . TUBAL LIGATION  2007  . TUBAL REVERSAL  2008   Family History: Both parents deceased, mother died during surgical procedure and father died of colon cancer when patient was 48 years old. Has 8 half siblings . Family History  Problem Relation Age of Onset  . Hypertension Mother   . Cancer Father 48       COLON  . Diabetes Brother   . Heart attack Brother        1/2 brother-maternal   Family Psychiatric  History: father had history of alcohol dependence and PTSD related to serving in [place], grandmother had history of depression.  Tobacco Screening: smokes 1/2 PPD Social History: 60, divorced, two children,ages 24,19. Currently lives with 68 year old daughter. Was working until recently, states she was fired earlier this week.  Social History   Substance and Sexual Activity  Alcohol Use Yes   Comment: occ      Social History   Substance and Sexual Activity  Drug Use Yes  . Types: Marijuana   Comment: occ    Additional Social History:  Allergies:   Allergies  Allergen Reactions  . Lisinopril Cough   Lab Results:  Results for orders placed or performed during the hospital encounter of 10/13/18 (from the past 48 hour(s))  Comprehensive metabolic panel     Status: Abnormal   Collection Time: 10/13/18 10:48 PM  Result Value Ref Range   Sodium 139 135 - 145 mmol/L   Potassium 3.9 3.5 - 5.1 mmol/L   Chloride 106 98 - 111 mmol/L   CO2 25 22 - 32 mmol/L   Glucose, Bld 109 (H) 70 - 99 mg/dL   BUN  15 6 - 20 mg/dL   Creatinine, Ser 0.93 0.44 - 1.00 mg/dL   Calcium 9.6 8.9 - 10.3 mg/dL   Total Protein 7.3 6.5 - 8.1 g/dL   Albumin 4.2 3.5 - 5.0 g/dL   AST 12 (L) 15 - 41 U/L   ALT 11 0 - 44 U/L   Alkaline Phosphatase 53 38 - 126 U/L   Total Bilirubin 0.2 (L) 0.3 - 1.2 mg/dL   GFR calc non Af Amer >60 >60 mL/min   GFR calc Af Amer >60 >60 mL/min   Anion gap 8 5 - 15    Comment: Performed at [facility], [place] [address], [place], [place] [address]  Ethanol     Status: None   Collection Time: 10/13/18 10:48 PM  Result Value Ref Range   Alcohol, Ethyl (B) <10 <  10 mg/dL    Comment: (NOTE) Lowest detectable limit for serum alcohol is 10 mg/dL. For medical purposes only. Performed at [facility], [place] [address], [place], [place] [address]   Salicylate level     Status: None   Collection Time: 10/13/18 10:48 PM  Result Value Ref Range   Salicylate Lvl <2.4 2.8 - 30.0 mg/dL    Comment: Performed at [facility], [place] [address], [place], [place] [address]  Acetaminophen level     Status: Abnormal   Collection Time: 10/13/18 10:48 PM  Result Value Ref Range   Acetaminophen (Tylenol), Serum <10 (L) 10 - 30 ug/mL    Comment: (NOTE) Therapeutic concentrations vary significantly. A range of 10-30 ug/mL  may be an effective concentration for many patients. However, some  are best treated at concentrations outside of this range. Acetaminophen concentrations >150 ug/mL at 4 hours after ingestion  and >50 ug/mL at 12 hours after ingestion are often associated with  toxic reactions. Performed at [facility], [place] [address], [place], [place] [address]   cbc     Status: Abnormal   Collection Time: 10/13/18 10:48 PM  Result Value Ref Range   WBC 12.0 (H) 4.0 - 10.5 K/uL   RBC 4.28 3.87 - 5.11 MIL/uL   Hemoglobin 13.1 12.0 - 15.0 g/dL   HCT 41.5 36.0 - 46.0 %   MCV 97.0 80.0 - 100.0 fL   MCH 30.6 26.0 - 34.0 pg    MCHC 31.6 30.0 - 36.0 g/dL   RDW 12.7 11.5 - 15.5 %   Platelets 306 150 - 400 K/uL   nRBC 0.0 0.0 - 0.2 %    Comment: Performed at [facility], [place] [address], [place], [place] [address]  I-Stat beta hCG blood, ED     Status: None   Collection Time: 10/13/18 10:53 PM  Result Value Ref Range   I-stat hCG, quantitative <5.0 <5 mIU/mL   Comment 3            Comment:   GEST. AGE      CONC.  (mIU/mL)   <=1 WEEK        5 - 50     2 WEEKS       50 - 500     3 WEEKS       100 - 10,000     4 WEEKS     1,000 - 30,000        FEMALE AND NON-PREGNANT FEMALE:     LESS THAN 5 mIU/mL     Blood Alcohol level:  Lab Results  Component Value Date   ETH <10 10/13/2018   ETH <5 41/96/2229    Metabolic Disorder Labs:  Lab Results  Component Value Date   HGBA1C 5.2 03/03/2014   MPG 103 03/03/2014   No results found for: PROLACTIN No results found for: CHOL, TRIG, HDL, CHOLHDL, VLDL, LDLCALC  Current Medications: Current Facility-Administered Medications  Medication Dose Route Frequency Provider Last Rate Last Dose  . acetaminophen (TYLENOL) tablet 650 mg  650 mg Oral Q6H PRN Laverle Hobby, PA-C   650 mg at 10/14/18 1147  . alum & mag hydroxide-simeth (MAALOX/MYLANTA) 200-200-20 MG/5ML suspension 30 mL  30 mL Oral Q4H PRN Patriciaann Clan E, PA-C      . cloNIDine (CATAPRES) tablet 0.1 mg  0.1 mg Oral QID Patriciaann Clan E, PA-C   0.1 mg at 10/14/18 1118   Followed by  . [  START ON 10/16/2018] cloNIDine (CATAPRES) tablet 0.1 mg  0.1 mg Oral BH-qamhs Simon, Spencer E, PA-C       Followed by  . [START ON 10/18/2018] cloNIDine (CATAPRES) tablet 0.1 mg  0.1 mg Oral QAC breakfast Patriciaann Clan E, PA-C      . dicyclomine (BENTYL) tablet 20 mg  20 mg Oral Q6H PRN Laverle Hobby, PA-C      . hydrOXYzine (ATARAX/VISTARIL) tablet 25 mg  25 mg Oral Q6H PRN Patriciaann Clan E, PA-C   25 mg at 10/14/18 0511  . [START ON 10/15/2018] Influenza vac split quadrivalent PF (FLUARIX) injection  0.5 mL  0.5 mL Intramuscular Tomorrow-1000 Janaki Exley A, MD      . loperamide (IMODIUM) capsule 2-4 mg  2-4 mg Oral PRN Patriciaann Clan E, PA-C      . magnesium hydroxide (MILK OF MAGNESIA) suspension 30 mL  30 mL Oral Daily PRN Laverle Hobby, PA-C      . methocarbamol (ROBAXIN) tablet 500 mg  500 mg Oral Q8H PRN Laverle Hobby, PA-C      . naproxen (NAPROSYN) tablet 500 mg  500 mg Oral BID PRN Patriciaann Clan E, PA-C   500 mg at 10/14/18 0511  . nicotine (NICODERM CQ - dosed in mg/24 hours) patch 21 mg  21 mg Transdermal Daily Amellia Panik, Myer Peer, MD   21 mg at 10/14/18 1122  . ondansetron (ZOFRAN-ODT) disintegrating tablet 4 mg  4 mg Oral Q6H PRN Laverle Hobby, PA-C      . [START ON 10/15/2018] pneumococcal 23 valent vaccine (PNU-IMMUNE) injection 0.5 mL  0.5 mL Intramuscular Tomorrow-1000 Karys Meckley A, MD      . traZODone (DESYREL) tablet 50 mg  50 mg Oral QHS,MR X 1 Simon, Spencer E, PA-C       PTA Medications: Medications Prior to Admission  Medication Sig Dispense Refill Last Dose  . albuterol (PROVENTIL HFA;VENTOLIN HFA) 108 (90 BASE) MCG/ACT inhaler Inhale 2 puffs into the lungs every 4 (four) hours as needed for wheezing or shortness of breath. 1 Inhaler 0 2019  . ALPRAZolam (XANAX) 0.25 MG tablet TAKE 1 TABLET BY MOUTH AT BEDTIME AS NEEDED FOR ANXIETY (Patient taking differently: Take 0.25 mg by mouth at bedtime as needed for anxiety. ) 30 tablet 0 10/13/2018 at Unknown time  . busPIRone (BUSPAR) 15 MG tablet Take 15 mg by mouth 2 (two) times daily.    10/12/2018 at Unknown time  . diltiazem (TIAZAC) 300 MG 24 hr capsule Take 300 mg by mouth daily.    10/12/2018 at Unknown time  . hydrochlorothiazide (HYDRODIURIL) 25 MG tablet Take 25 mg by mouth daily.   10/12/2018 at Unknown time  . levothyroxine (SYNTHROID, LEVOTHROID) 50 MCG tablet TAKE 1 TABLET BY MOUTH EVERY DAY (Patient taking differently: Take 50 mcg by mouth daily. ) 90 tablet 1 10/13/2018 at Unknown time  . LO LOESTRIN  FE 1 MG-10 MCG / 10 MCG tablet Take 1 tablet by mouth daily. 3 Package 2 10/13/2018 at Unknown time  . losartan (COZAAR) 50 MG tablet Take 50 mg by mouth daily.  4 10/12/2018 at Unknown time  . metroNIDAZOLE (FLAGYL) 500 MG tablet Take 1 tablet (500 mg total) by mouth 2 (two) times daily. (Patient not taking: Reported on 10/13/2018) 14 tablet 0 Not Taking at Unknown time  . Multiple Vitamins-Calcium (ONE-A-DAY WOMENS FORMULA PO) Take 1 tablet by mouth daily.    10/13/2018 at Unknown time  . polyethylene glycol powder (GLYCOLAX/MIRALAX) powder  Take 17 g by mouth once. Dissolve one capful powder into any liquid and drink once a day. You can increase to 2 times a day if unable to have a bowel movement in 3-5 days. (Patient taking differently: Take 17 g by mouth daily as needed for moderate constipation. Dissolve one capful powder into any liquid and drink once a day. You can increase to 2 times a day if unable to have a bowel movement in 3-5 days.) 500 g 0 Past Month at Unknown time  . traMADol (ULTRAM) 50 MG tablet Take 100 mg by mouth 2 (two) times daily.    Past Week at Unknown time  . venlafaxine XR (EFFEXOR-XR) 150 MG 24 hr capsule Take 225 mg by mouth daily.  0 10/12/2018 at Unknown time  . venlafaxine XR (EFFEXOR-XR) 75 MG 24 hr capsule Take 75 mg by mouth daily.   10/12/2018 at Unknown time    Musculoskeletal: Strength & Muscle Tone: within normal limits Gait & Station: normal Patient leans: N/A  Psychiatric Specialty Exam: Physical Exam  Review of Systems  Constitutional: Positive for chills.  HENT: Negative.   Eyes: Negative.   Respiratory: Negative.   Cardiovascular: Negative.   Gastrointestinal: Negative for diarrhea, nausea and vomiting.  Genitourinary: Negative.   Musculoskeletal: Positive for myalgias.  Skin: Negative.   Neurological: Negative for seizures.  Endo/Heme/Allergies: Negative.   Psychiatric/Behavioral: Positive for depression, substance abuse and suicidal ideas. The  patient is nervous/anxious.   All other systems reviewed and are negative.   Blood pressure 116/87, pulse 90, temperature 98.5 F (36.9 C), temperature source Oral, resp. rate 16, height 5\' 4"  (1.626 m), weight 99.8 kg, last menstrual period 10/09/2018.Body mass index is 37.76 kg/m.  General Appearance: Well Groomed  Eye Contact:  Good  Speech:  Normal Rate  Volume:  Normal  Mood:  Depressed  Affect:  Congruent and constricted affect, briefly tearful at times  Thought Process:  Linear and Descriptions of Associations: Intact  Orientation:  Other:  fully alert and attentive  Thought Content:  denies hallucinations, no delusions, not internally preoccupied   Suicidal Thoughts:  No denies current suicidal ideations and contracts for safety on unit   Homicidal Thoughts:  No  Memory:  recent and remote grossly intact   Judgement:  Fair  Insight:  Fair  Psychomotor Activity:  Normal- does not appear to be in any acute distress.   Concentration:  Concentration: Good and Attention Span: Good  Recall:  Good  Fund of Knowledge:  Good  Language:  Good  Akathisia:  Negative  Handed:  Right  AIMS (if indicated):     Assets:  Communication Skills Desire for Improvement Resilience  ADL's:  Intact  Cognition:  WNL  Sleep:       Treatment Plan Summary: Daily contact with patient to assess and evaluate symptoms and progress in treatment, Medication management, Plan inpatient treatment  and medications as below  Observation Level/Precautions:  15 minute checks  Laboratory:  TSH  Psychotherapy:  [facility], group therapy   Medications:  Patient reports she does not feel her current medication regimen is working , and wants to taper or  discontinue Effexor XR, but states she is concerned about possible withdrawal symptoms, particularly as she is on a high dose  Decrease Effexor XR to 150 mgrs QDAY initially. Start Remeron 7.5 mgrs QHS for depression and insomnia Start Abilify 2 mgrs QDAY for  antidepressant augmentation For now will not restart Buspar, as she states it did not  seem to work and to simplify treatment regimen Has been started on Clonidine detox protocol to minimize potential Tramadol WDL Continue Losartan , Diltizaem CD ( which she states she was taking at 300 mgrs QDAY but wanting to take lower dose). Continue Synthroid 50 micrograms QDAY,    Consultations:  As needed   Discharge Concerns:    Estimated LOS: 5 days   Other:     Physician Treatment Plan for Primary Diagnosis: Long Term Goal(s):  MDD  Short Term Goals: Ability to identify changes in lifestyle to reduce recurrence of condition will improve and Ability to maintain clinical measurements within normal limits will improve  Physician Treatment Plan for Secondary Diagnosis: Suicidal Ideations Long Term Goal(s): Improvement in symptoms so as ready for discharge  Short Term Goals: Ability to identify changes in lifestyle to reduce recurrence of condition will improve, Ability to verbalize feelings will improve, Ability to disclose and discuss suicidal ideas, Ability to demonstrate self-control will improve, Ability to identify and develop effective coping behaviors will improve and Ability to maintain clinical measurements within normal limits will improve  I certify that inpatient services furnished can reasonably be expected to improve the patient's condition.    Jenne Campus, MD 2/19/20201:35 PM

## 2018-10-14 NOTE — Tx Team (Signed)
Interdisciplinary Treatment and Diagnostic Plan Update  10/14/2018 Time of Session: 10:30am Sydney Glover MRN: [id number]  Principal Diagnosis: <principal problem not specified>  Secondary Diagnoses: Active Problems:   MDD (major depressive disorder), severe (HCC)   Current Medications:  Current Facility-Administered Medications  Medication Dose Route Frequency Provider Last Rate Last Dose  . acetaminophen (TYLENOL) tablet 650 mg  650 mg Oral Q6H PRN Laverle Hobby, PA-C      . alum & mag hydroxide-simeth (MAALOX/MYLANTA) 200-200-20 MG/5ML suspension 30 mL  30 mL Oral Q4H PRN Patriciaann Clan E, PA-C      . cloNIDine (CATAPRES) tablet 0.1 mg  0.1 mg Oral QID Patriciaann Clan E, PA-C       Followed by  . [START ON 10/16/2018] cloNIDine (CATAPRES) tablet 0.1 mg  0.1 mg Oral BH-qamhs Simon, Spencer E, PA-C       Followed by  . [START ON 10/18/2018] cloNIDine (CATAPRES) tablet 0.1 mg  0.1 mg Oral QAC breakfast Patriciaann Clan E, PA-C      . dicyclomine (BENTYL) tablet 20 mg  20 mg Oral Q6H PRN Laverle Hobby, PA-C      . hydrOXYzine (ATARAX/VISTARIL) tablet 25 mg  25 mg Oral Q6H PRN Patriciaann Clan E, PA-C   25 mg at 10/14/18 0511  . [START ON 10/15/2018] Influenza vac split quadrivalent PF (FLUARIX) injection 0.5 mL  0.5 mL Intramuscular Tomorrow-1000 Cobos, Fernando A, MD      . loperamide (IMODIUM) capsule 2-4 mg  2-4 mg Oral PRN Patriciaann Clan E, PA-C      . magnesium hydroxide (MILK OF MAGNESIA) suspension 30 mL  30 mL Oral Daily PRN Laverle Hobby, PA-C      . methocarbamol (ROBAXIN) tablet 500 mg  500 mg Oral Q8H PRN Laverle Hobby, PA-C      . naproxen (NAPROSYN) tablet 500 mg  500 mg Oral BID PRN Patriciaann Clan E, PA-C   500 mg at 10/14/18 6222  . nicotine (NICODERM CQ - dosed in mg/24 hours) patch 21 mg  21 mg Transdermal Daily Cobos, Myer Peer, MD      . ondansetron (ZOFRAN-ODT) disintegrating tablet 4 mg  4 mg Oral Q6H PRN Laverle Hobby, PA-C      . [START ON 10/15/2018]  pneumococcal 23 valent vaccine (PNU-IMMUNE) injection 0.5 mL  0.5 mL Intramuscular Tomorrow-1000 Cobos, Myer Peer, MD      . traZODone (DESYREL) tablet 50 mg  50 mg Oral QHS,MR X 1 Simon, Spencer E, PA-C       PTA Medications: Medications Prior to Admission  Medication Sig Dispense Refill Last Dose  . albuterol (PROVENTIL HFA;VENTOLIN HFA) 108 (90 BASE) MCG/ACT inhaler Inhale 2 puffs into the lungs every 4 (four) hours as needed for wheezing or shortness of breath. 1 Inhaler 0 2019  . ALPRAZolam (XANAX) 0.25 MG tablet TAKE 1 TABLET BY MOUTH AT BEDTIME AS NEEDED FOR ANXIETY (Patient taking differently: Take 0.25 mg by mouth at bedtime as needed for anxiety. ) 30 tablet 0 10/13/2018 at Unknown time  . busPIRone (BUSPAR) 15 MG tablet Take 15 mg by mouth 2 (two) times daily.    10/12/2018 at Unknown time  . diltiazem (TIAZAC) 300 MG 24 hr capsule Take 300 mg by mouth daily.    10/12/2018 at Unknown time  . hydrochlorothiazide (HYDRODIURIL) 25 MG tablet Take 25 mg by mouth daily.   10/12/2018 at Unknown time  . levothyroxine (SYNTHROID, LEVOTHROID) 50 MCG tablet TAKE 1 TABLET BY MOUTH  EVERY DAY (Patient taking differently: Take 50 mcg by mouth daily. ) 90 tablet 1 10/13/2018 at Unknown time  . LO LOESTRIN FE 1 MG-10 MCG / 10 MCG tablet Take 1 tablet by mouth daily. 3 Package 2 10/13/2018 at Unknown time  . losartan (COZAAR) 50 MG tablet Take 50 mg by mouth daily.  4 10/12/2018 at Unknown time  . metroNIDAZOLE (FLAGYL) 500 MG tablet Take 1 tablet (500 mg total) by mouth 2 (two) times daily. (Patient not taking: Reported on 10/13/2018) 14 tablet 0 Not Taking at Unknown time  . Multiple Vitamins-Calcium (ONE-A-DAY WOMENS FORMULA PO) Take 1 tablet by mouth daily.    10/13/2018 at Unknown time  . polyethylene glycol powder (GLYCOLAX/MIRALAX) powder Take 17 g by mouth once. Dissolve one capful powder into any liquid and drink once a day. You can increase to 2 times a day if unable to have a bowel movement in 3-5 days.  (Patient taking differently: Take 17 g by mouth daily as needed for moderate constipation. Dissolve one capful powder into any liquid and drink once a day. You can increase to 2 times a day if unable to have a bowel movement in 3-5 days.) 500 g 0 Past Month at Unknown time  . traMADol (ULTRAM) 50 MG tablet Take 100 mg by mouth 2 (two) times daily.    Past Week at Unknown time  . venlafaxine XR (EFFEXOR-XR) 150 MG 24 hr capsule Take 225 mg by mouth daily.  0 10/12/2018 at Unknown time  . venlafaxine XR (EFFEXOR-XR) 75 MG 24 hr capsule Take 75 mg by mouth daily.   10/12/2018 at Unknown time    Patient Stressors: Financial difficulties Marital or family conflict  Patient Strengths: Ability for insight Average or above average intelligence Communication skills Supportive family/friends  Treatment Modalities: Medication Management, Group therapy, Case management,  1 to 1 session with clinician, Psychoeducation, Recreational therapy.   Physician Treatment Plan for Primary Diagnosis: <principal problem not specified> Long Term Goal(s):     Short Term Goals:    Medication Management: Evaluate patient's response, side effects, and tolerance of medication regimen.  Therapeutic Interventions: 1 to 1 sessions, Unit Group sessions and Medication administration.  Evaluation of Outcomes: Progressing  Physician Treatment Plan for Secondary Diagnosis: Active Problems:   MDD (major depressive disorder), severe ([place])  Long Term Goal(s):     Short Term Goals:       Medication Management: Evaluate patient's response, side effects, and tolerance of medication regimen.  Therapeutic Interventions: 1 to 1 sessions, Unit Group sessions and Medication administration.  Evaluation of Outcomes: Progressing   RN Treatment Plan for Primary Diagnosis: <principal problem not specified> Long Term Goal(s): Knowledge of disease and therapeutic regimen to maintain health will improve  Short Term Goals: Ability  to participate in decision making will improve, Ability to verbalize feelings will improve, Ability to disclose and discuss suicidal ideas and Ability to identify and develop effective coping behaviors will improve  Medication Management: RN will administer medications as ordered by provider, will assess and evaluate patient's response and provide education to patient for prescribed medication. RN will report any adverse and/or side effects to prescribing provider.  Therapeutic Interventions: 1 on 1 counseling sessions, Psychoeducation, Medication administration, Evaluate responses to treatment, Monitor vital signs and CBGs as ordered, Perform/monitor CIWA, COWS, AIMS and Fall Risk screenings as ordered, Perform wound care treatments as ordered.  Evaluation of Outcomes: Progressing   LCSW Treatment Plan for Primary Diagnosis: <principal problem not specified> Long  Term Goal(s): Safe transition to appropriate next level of care at discharge, Engage patient in therapeutic group addressing interpersonal concerns.  Short Term Goals: Engage patient in aftercare planning with referrals and resources, Increase social support, Increase emotional regulation, Identify triggers associated with mental health/substance abuse issues and Increase skills for wellness and recovery  Therapeutic Interventions: Assess for all discharge needs, 1 to 1 time with Social worker, Explore available resources and support systems, Assess for adequacy in community support network, Educate family and significant other(s) on suicide prevention, Complete Psychosocial Assessment, Interpersonal group therapy.  Evaluation of Outcomes: Progressing   Progress in Treatment: Attending groups: Yes. Participating in groups: Yes. Taking medication as prescribed: Yes. Toleration medication: Yes. Family/Significant other contact made: No, will contact:  supports if consents are gratned Patient understands diagnosis: Yes. Discussing  patient identified problems/goals with staff: Yes. Medical problems stabilized or resolved: Yes. Denies suicidal/homicidal ideation: No. Issues/concerns per patient self-inventory: Yes.  New problem(s) identified: No, Describe:  recently unemployed, may have difficulty paying for prescriptions at follow up  [place] Term/Long Term Goal(s): medication management for mood stabilization; elimination of SI thoughts; development of comprehensive mental wellness/sobriety plan.  Patient Goals: "I want to feel better." Wants medication management or adjustment as she feels the psychiatric medications her PCP prescribed are not effective.   Discharge Plan or Barriers: Discharge home with outpatient follow up; most likely [facility].   Reason for Continuation of Hospitalization: Anxiety Depression Suicidal ideation  Estimated Length of Stay: 3-5 days  Attendees: Patient: Sydney Glover 10/14/2018 11:01 AM  Physician: Larene Beach 10/14/2018 11:01 AM  Nursing: Estill Bamberg, RN 10/14/2018 11:01 AM  RN Care Manager: 10/14/2018 11:01 AM  Social Worker: Stephanie Acre, [place] 10/14/2018 11:01 AM  Recreational Therapist:  10/14/2018 11:01 AM  Other:  10/14/2018 11:01 AM  Other:  10/14/2018 11:01 AM  Other: 10/14/2018 11:01 AM    Scribe for Treatment Team: Joellen Jersey, [place] 10/14/2018 11:01 AM

## 2018-10-14 NOTE — Progress Notes (Signed)
Patient ID: Sydney Glover, female   DOB: [birth year], 48 y.o.   MRN: [id number]  Patient has been up in the milieu despite getting little sleep last night. Patient does endorse some withdrawal symptoms from Tramadol this afternoon and is continued on COW protocol and provided Clonidine. Patient is seen interacting in the dayroom with peers and attending groups. Patient is provided and educated about new orders and verbalizes understanding of regimen. Patient endorses mild back pain and is provided current PRNs ordered. EKG performed and is reviewed by MD.

## 2018-10-14 NOTE — BHH Group Notes (Signed)
Adult Psychoeducational Group Note  Date:  10/14/2018 Time:  10:40 PM  Group Topic/Focus:  Wrap-Up Group:   The focus of this group is to help patients review their daily goal of treatment and discuss progress on daily workbooks.  Participation Level:  Active  Participation Quality:  Appropriate and Attentive  Affect:  Appropriate  Cognitive:  Alert and Appropriate  Insight: Appropriate and Good  Engagement in Group:  Engaged  Modes of Intervention:  Discussion and Education  Additional Comments:  Pt attended and participated in wrap up group this evening. Pt rated their day a 6/10, due to them having a pretty good day even though they have not slept. Pt completed their goal to talk with the Dr and manage their medications.   Cristi Loron 10/14/2018, 10:40 PM

## 2018-10-14 NOTE — Tx Team (Signed)
Initial Treatment Plan 10/14/2018 5:49 AM Sydney Glover KFM:[id number]    PATIENT STRESSORS: Financial difficulties Marital or family conflict   PATIENT STRENGTHS: Ability for insight Average or above average intelligence Communication skills Supportive family/friends   PATIENT IDENTIFIED PROBLEMS: "I want to get better" "I need to get my meds figured out" "I need to be able to handle my anxiety"                     DISCHARGE CRITERIA:  Improved stabilization in mood, thinking, and/or behavior Need for constant or close observation no longer present  PRELIMINARY DISCHARGE PLAN: Return to previous living arrangement  PATIENT/FAMILY INVOLVEMENT: This treatment plan has been presented to and reviewed with the patient, Sydney Glover.  The patient and family have been given the opportunity to ask questions and make suggestions.  Noel Christmas, RN 10/14/2018, 5:49 AM
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
## 2018-10-14 NOTE — Progress Notes (Addendum)
Patient ID: Sydney Glover, female   DOB: [birth year], 48 y.o.   MRN: [id number]  Nursing Progress Note [phone]  On initial approach, patient is resting in bed but is awake. Patient was an early morning admission to the unit and reports she "went to breakfast but was up all night". Patient presents calm, pleasant and cooperative but reports she would like to rest. Patient denies concerns for Probation officer. Patient denied need for Clonidine or Nicotine patch this morning. Patient VSS, no withdrawal symptoms noted. Patient currently denies SI/HI/AVH.   Patient is educated about and provided medication per provider's orders. Patient safety maintained with q15 min safety checks and high fall risk precautions. Emotional support given, 1:1 interaction, and active listening provided. Patient encouraged to attend meals, groups, and work on treatment plan and goals. Labs, vital signs and patient behavior monitored throughout shift. COW protocol in place.  Patient contracts for safety with staff. Patient remains safe on the unit at this time and agrees to come to staff with any issues/concerns. Will continue to support and monitor.
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
## 2018-10-14 NOTE — Progress Notes (Signed)
Recived Sydney Glover from the main ED, alert and pleasant. She endorsed feeling anxious and depressed at this time. She was assesses by the doctor and medicated per order. She got a bed at [facility], room 402-2, report was called to nurse Nira Conn and the transfer order was  completed by the doctor. She was transferred at 0359 hrs via [place] without incident.

## 2018-10-14 NOTE — BHH Group Notes (Signed)
[facility] Group Notes:  Nursing Psychoeducational Group  Date:  10/14/2018  Time:  4:00 PM  Type of Therapy:  Psychoeducational Skills  Participation Level:  Active  Participation Quality:  Appropriate, Attentive, Sharing and Supportive  Affect:  Blunted and Tearful  Cognitive:  Alert, Appropriate and Oriented  Insight:  Appropriate and Improving  Engagement in Group:  Developing/Improving, Engaged and Supportive  Modes of Intervention:  Discussion, Socialization and Support  Summary of Progress/Problems: Patient discussed she is anxious about her children visiting her and seeing her in the hospital. Patient discussed worst and best case scenarios of this situation and discussed her anxiety with group.   [place] 10/14/2018, 5:30 PM

## 2018-10-14 NOTE — ED Provider Notes (Signed)
[facility] DEPT Provider Note  CSN: [id number] Arrival date & time: 10/13/18 2213  Chief Complaint(s) Suicidal  HPI Sydney Glover is a 48 y.o. female with a history of anxiety and depression who presents to the emergency department with worsening depression with suicidal ideation and plan to overdose on pills.  Patient reported that she has attempted suicide in the past with overdosing on tramadol.  Patient denies taking any additional medications today.  Patient was actually assessed by behavioral health earlier this evening and sent here for medical clearance.  She endorses chronic back pain without any acute exacerbation.  Otherwise denies any physical complaints at this time.  She denies any homicidal ideation or AVH.  HPI  Past Medical History Past Medical History:  Diagnosis Date  . Anxiety   . Depression   . History of colonic polyps    BENIGN  . Psoriasis   . Tachycardia    Patient Active Problem List   Diagnosis Date Noted  . Shortness of breath 09/14/2017  . Murmur 09/14/2017  . Major depressive disorder, recurrent, severe without psychotic features ([place])   . Panic disorder with agoraphobia 03/15/2015  . MDD (major depressive disorder), recurrent episode, severe ([place]) 03/14/2015  . Anxiety and depression 11/11/2012  . Personal history of colonic polyps 11/11/2012   Home Medication(s) Prior to Admission medications   Medication Sig Start Date End Date Taking? Authorizing Provider  albuterol (PROVENTIL HFA;VENTOLIN HFA) 108 (90 BASE) MCG/ACT inhaler Inhale 2 puffs into the lungs every 4 (four) hours as needed for wheezing or shortness of breath. 03/17/15  Yes Rankin, Shuvon B, NP  ALPRAZolam (XANAX) 0.25 MG tablet TAKE 1 TABLET BY MOUTH AT BEDTIME AS NEEDED FOR ANXIETY Patient taking differently: Take 0.25 mg by mouth at bedtime as needed for anxiety.  09/25/18  Yes Huel Cote, NP  busPIRone (BUSPAR) 15 MG tablet Take 15 mg by mouth  2 (two) times daily.    Yes [provider]  diltiazem (TIAZAC) 300 MG 24 hr capsule Take 300 mg by mouth daily.    Yes [provider]  hydrochlorothiazide (HYDRODIURIL) 25 MG tablet Take 25 mg by mouth daily. 09/29/18  Yes [provider]  levothyroxine (SYNTHROID, LEVOTHROID) 50 MCG tablet TAKE 1 TABLET BY MOUTH EVERY DAY Patient taking differently: Take 50 mcg by mouth daily.  10/27/17  Yes Young, Candiss Norse, NP  LO LOESTRIN FE 1 MG-10 MCG / 10 MCG tablet Take 1 tablet by mouth daily. 06/23/18  Yes Huel Cote, NP  losartan (COZAAR) 50 MG tablet Take 50 mg by mouth daily. 05/07/18  Yes [provider]  Multiple Vitamins-Calcium (ONE-A-DAY WOMENS FORMULA PO) Take 1 tablet by mouth daily.    Yes [provider]  polyethylene glycol powder (GLYCOLAX/MIRALAX) powder Take 17 g by mouth once. Dissolve one capful powder into any liquid and drink once a day. You can increase to 2 times a day if unable to have a bowel movement in 3-5 days. Patient taking differently: Take 17 g by mouth daily as needed for moderate constipation. Dissolve one capful powder into any liquid and drink once a day. You can increase to 2 times a day if unable to have a bowel movement in 3-5 days. 07/17/15  Yes Forde Dandy, MD  traMADol (ULTRAM) 50 MG tablet Take 100 mg by mouth 2 (two) times daily.    Yes [provider]  venlafaxine XR (EFFEXOR-XR) 150 MG 24 hr capsule Take 225 mg  by mouth daily. 08/11/17  Yes [provider]  venlafaxine XR (EFFEXOR-XR) 75 MG 24 hr capsule Take 75 mg by mouth daily. 09/11/17  Yes [provider]  metroNIDAZOLE (FLAGYL) 500 MG tablet Take 1 tablet (500 mg total) by mouth 2 (two) times daily. Patient not taking: Reported on 10/13/2018 06/23/18   Huel Cote, NP                                                                                                                                    Past Surgical History Past Surgical  History:  Procedure Laterality Date  . ANTERIOR CRUCIATE LIGAMENT REPAIR  2011  . [place]   X2  . COLPOSCOPY    . TEE WITHOUT CARDIOVERSION N/A 09/19/2017   Procedure: TRANSESOPHAGEAL ECHOCARDIOGRAM (TEE);  Surgeon: Adrian Prows, MD;  Location: [facility];  Service: Cardiovascular;  Laterality: N/A;  . TUBAL LIGATION  2007  . TUBAL REVERSAL  2008   Family History Family History  Problem Relation Age of Onset  . Hypertension Mother   . Cancer Father 73       COLON  . Diabetes Brother   . Heart attack Brother        1/2 brother-maternal    Social History Social History   Tobacco Use  . Smoking status: Former Smoker    Packs/day: 0.25    Years: 20.00    Pack years: 5.00    Types: Cigarettes  . Smokeless tobacco: Former Systems developer    Quit date: 11/12/2010  Substance Use Topics  . Alcohol use: Yes    Comment: occ   . Drug use: No   Allergies Lisinopril  Review of Systems Review of Systems All other systems are reviewed and are negative for acute change except as noted in the HPI  Physical Exam Vital Signs  I have reviewed the triage vital signs BP (!) 139/105 (BP Location: Left Arm)   Pulse 81   Temp 98.5 F (36.9 C) (Oral)   Resp 16   Ht 5\' 3"  (1.6 m)   Wt 99.8 kg   LMP 10/09/2018   SpO2 97%   BMI 38.97 kg/m   Physical Exam Vitals signs reviewed.  Constitutional:      General: She is not in acute distress.    Appearance: She is well-developed. She is not diaphoretic.  HENT:     Head: Normocephalic and atraumatic.     Nose: Nose normal.  Eyes:     General: No scleral icterus.       Right eye: No discharge.        Left eye: No discharge.     Conjunctiva/sclera: Conjunctivae normal.     Pupils: Pupils are equal, round, and reactive to light.  Neck:     Musculoskeletal: Normal range of motion and neck supple.  Cardiovascular:     Rate and Rhythm: Normal rate and  regular rhythm.     Heart sounds: No murmur. No friction rub. No gallop.    Pulmonary:     Effort: Pulmonary effort is normal. No respiratory distress.     Breath sounds: Normal breath sounds. No stridor. No rales.  Abdominal:     General: There is no distension.     Palpations: Abdomen is soft.     Tenderness: There is no abdominal tenderness.  Musculoskeletal:        General: No tenderness.  Skin:    General: Skin is warm and dry.     Findings: No erythema or rash.  Neurological:     Mental Status: She is alert and oriented to person, place, and time.     ED Results and Treatments Labs (all labs ordered are listed, but only abnormal results are displayed) Labs Reviewed  COMPREHENSIVE METABOLIC PANEL - Abnormal; Notable for the following components:      Result Value   Glucose, Bld 109 (*)    AST 12 (*)    Total Bilirubin 0.2 (*)    All other components within normal limits  ACETAMINOPHEN LEVEL - Abnormal; Notable for the following components:   Acetaminophen (Tylenol), Serum <10 (*)    All other components within normal limits  CBC - Abnormal; Notable for the following components:   WBC 12.0 (*)    All other components within normal limits  ETHANOL  SALICYLATE LEVEL  RAPID URINE DRUG SCREEN, HOSP PERFORMED  I-STAT BETA HCG BLOOD, ED ([facility], [facility], [facility] ONLY)                                                                                                                         EKG  EKG Interpretation  Date/Time:    Ventricular Rate:    PR Interval:    QRS Duration:   QT Interval:    QTC Calculation:   R Axis:     Text Interpretation:        Radiology No results found. Pertinent labs & imaging results that were available during my care of the patient were reviewed by me and considered in my medical decision making (see chart for details).  Medications Ordered in ED Medications  albuterol (PROVENTIL HFA;VENTOLIN HFA) 108 (90 Base) MCG/ACT inhaler 2 puff (has no administration in time range)  busPIRone (BUSPAR) tablet 15 mg (has no  administration in time range)  diltiazem (CARDIZEM CD) 24 hr capsule 300 mg (has no administration in time range)  hydrochlorothiazide (HYDRODIURIL) tablet 25 mg (has no administration in time range)  levothyroxine (SYNTHROID, LEVOTHROID) tablet 50 mcg (has no administration in time range)  traMADol (ULTRAM) tablet 100 mg (has no administration in time range)  venlafaxine XR (EFFEXOR-XR) 24 hr capsule 75 mg (has no administration in time range)  ALPRAZolam (XANAX) tablet 0.25 mg (has no administration in time range)  Procedures Procedures  (including critical care time)  Medical Decision Making / ED Course I have reviewed the nursing notes for this encounter and the patient's prior records (if available in EHR or on provided paperwork).    Screening labs obtained and patient is medically clear for behavioral health admission.  Final Clinical Impression(s) / ED Diagnoses Final diagnoses:  Other depression  Suicidal ideation      This chart was dictated using voice recognition software.  Despite best efforts to proofread,  errors can occur which can change the documentation meaning.   Fatima Blank, MD 10/14/18 [phone]
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
## 2018-10-14 NOTE — Plan of Care (Signed)
  Problem: Education: Goal: Knowledge of [facility] Education information/materials will improve Outcome: Progressing   Problem: Physical Regulation: Goal: Ability to maintain clinical measurements within normal limits will improve Outcome: Progressing   Problem: Safety: Goal: Periods of time without injury will increase Outcome: Progressing

## 2018-10-15 DIAGNOSIS — F322 Major depressive disorder, single episode, severe without psychotic features: Secondary | ICD-10-CM | POA: Diagnosis not present

## 2018-10-15 LAB — TSH: TSH: 4.545 u[IU]/mL — ABNORMAL HIGH (ref 0.350–4.500)

## 2018-10-15 LAB — LIPID PANEL
Cholesterol: 240 mg/dL — ABNORMAL HIGH (ref 0–200)
HDL: 56 mg/dL (ref >40)
LDL Cholesterol: 148 mg/dL — ABNORMAL HIGH (ref 0–99)
Total CHOL/HDL Ratio: 4.3 RATIO
Triglycerides: 182 mg/dL — ABNORMAL HIGH (ref <150)
VLDL: 36 mg/dL (ref 0–40)

## 2018-10-15 NOTE — Plan of Care (Signed)
  Problem: Education: Goal: Knowledge of [facility] Education information/materials will improve Outcome: Progressing Goal: Emotional status will improve Outcome: Progressing   Problem: Activity: Goal: Interest or engagement in activities will improve Outcome: Progressing   Problem: Health Behavior/Discharge Planning: Goal: Compliance with treatment plan for underlying cause of condition will improve Outcome: Progressing   Problem: Physical Regulation: Goal: Ability to maintain clinical measurements within normal limits will improve Outcome: Progressing   Problem: Safety: Goal: Periods of time without injury will increase Outcome: Progressing

## 2018-10-15 NOTE — Progress Notes (Signed)
D: Pt was in dayroom upon initial approach.  Pt presents with appropriate affect and mood.  She smiles with interaction.  Pt describes her day as "pretty good."  Her goal is "just talking, participating in the classes, get my head right with this medicine."  Pt discusses how she started two new medications recently.  Pt denies SI/HI, denies hallucinations, reports chronic back pain of 6/10, denies withdrawal symptoms.  Pt has been visible in milieu interacting with peers and staff appropriately.  Pt attended evening group.    A: Introduced self to pt.  Met with pt 1:1.  Actively listened to pt and offered support and encouragement.  Medications administered per order.  PRN medication administered for pain.  Q15 minute safety checks maintained.  R: Pt is safe on the unit.  Pt is compliant with medications.  Pt verbally contracts for safety.  Will continue to monitor and assess.

## 2018-10-15 NOTE — BHH Group Notes (Signed)
[facility] Group Therapy 10/15/2018 10:32 AM  Type of Therapy: [facility] Presentation  Participation Level: Active  Participation Quality: Attentive  Affect: Appropriate  Cognitive: Oriented  Insight: Developing/Improving  Engagement in Therapy: Engaged  Modes of Intervention: Discussion, Education and Socialization   Summary of Progress/Problems: [place] ([place]) Speaker came to talk about his personal journey with mental health. The pt processed ways by which to relate to the speaker. [place] speaker provided handouts and educational information pertaining to groups and services offered by the [facility]. Pt was engaged in speaker's presentation and was receptive to resources provided.   Stephanie Acre, MSW, [facility] 10/15/2018 10:32 AM

## 2018-10-15 NOTE — Progress Notes (Signed)
D: Patient observed in dayroom interacting with peers. Patient states she is ready for bed and proceeds to take her night time meds without difficulty. She reports having a good day and feels she's acclimating to the unit. Patient's affect anxious with congruent mood. Denies pain, physical complaints.   A: Medicated per orders, no prns given or requested. Medication education provided. Level III obs in place for safety. Emotional support offered. Patient encouraged to complete Suicide Safety Plan before discharge. Encouraged to attend and participate in unit programming.  Fall prevention plan in place and reviewed with patient as pt is a high fall risk.   R: Patient verbalizes understanding of POC, falls prevention education. Patient denies SI/HI/AVH and remains safe on level III obs. Will continue to monitor throughout the night.

## 2018-10-15 NOTE — Progress Notes (Signed)
[facility] MD Progress Note  10/15/2018 3:07 PM Sydney Glover  MRN:  [id number] Subjective:  Patient states she is feeling" a little better" today. This AM had headache, now improved. Currently does not endorse significant  Venlafaxine WDL symptoms . Also, does not endorse significant Tramadol/ Opiate withdrawal symptoms at this time. Reports she had a good visit from her daughter yesterday evening,and states her affect brightened during the visit. Denies medication side effects thus far- did feel sedated after taking Remeron.  Objective: I have discussed case with treatment team and have met with patient. 48 year old female, presented to the hospital voluntarily due to worsening depression, passive suicidal ideations, neurovegetative symptoms.  She states she has chronic depression and that current psychiatric medications (Effexor XR, BuSpar) were no longer working.  Depression became further exacerbated recently after she was fired from her job.  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   Patient reports she is feeling " a little better" today, but still describes some depression and presents with ongoing  ruminations about recent losses, mainly employment loss and resulting financial difficulties . Denies any current suicidal or self injurious ideations, contracts for safety on unit  Currently tolerating medications well and, as above,  currently denies significant Effexor WDL symptoms. Visible in day room, going to some groups, behavior on unit in good control. Pleasant on approach. Labs reviewed-TSH 4.54, lipid panel-mild hypercholesterolemia (240) Principal Problem: MDD Diagnosis: Active Problems:   MDD (major depressive disorder), severe ([place])  Total Time spent with patient: 20 minutes   Past Psychiatric History:   Past Medical  History:  Past Medical History:  Diagnosis Date  . Anxiety   . Depression   . History of colonic polyps    BENIGN  . Psoriasis   . Tachycardia     Past Surgical History:  Procedure Laterality Date  . ANTERIOR CRUCIATE LIGAMENT REPAIR  2011  . [place]   X2  . COLPOSCOPY    . TEE WITHOUT CARDIOVERSION N/A 09/19/2017   Procedure: TRANSESOPHAGEAL ECHOCARDIOGRAM (TEE);  Surgeon: Adrian Prows, MD;  Location: [place];  Service: Cardiovascular;  Laterality: N/A;  . TUBAL LIGATION  2007  . TUBAL REVERSAL  2008   Family History:  Family History  Problem Relation Age of Onset  . Hypertension Mother   . Cancer Father 45       COLON  . Diabetes Brother   . Heart attack Brother        1/2 brother-maternal   Family Psychiatric  History:  Social History:  Social History   Substance and Sexual Activity  Alcohol Use Yes   Comment: occ      Social History   Substance and Sexual Activity  Drug Use Yes  . Types: Marijuana   Comment: occ    Social History   Socioeconomic History  . Marital status: Single    Spouse name: Not on file  . Number of children: Not on file  . Years of education: Not on file  . Highest education level: Not on file  Occupational History  . Not on file  Social Needs  . Financial resource strain: Not on file  . Food insecurity:    Worry: Not on file    Inability: Not on file  . Transportation needs:    Medical: Not on file  Non-medical: Not on file  Tobacco Use  . Smoking status: Former Smoker    Packs/day: 0.50    Years: 20.00    Pack years: 10.00    Types: Cigarettes  . Smokeless tobacco: Former Systems developer    Quit date: 11/12/2010  Substance and Sexual Activity  . Alcohol use: Yes    Comment: occ   . Drug use: Yes    Types: Marijuana    Comment: occ  . Sexual activity: Yes    Birth control/protection: Surgical, Pill    Comment: INTERCOUTRSE AGE 26, MORE THAN 5 SEXUAL PARTNERS  Lifestyle  . Physical activity:     Days per week: Not on file    Minutes per session: Not on file  . Stress: Not on file  Relationships  . Social connections:    Talks on phone: Not on file    Gets together: Not on file    Attends religious service: Not on file    Active member of club or organization: Not on file    Attends meetings of clubs or organizations: Not on file    Relationship status: Not on file  Other Topics Concern  . Not on file  Social History Narrative  . Not on file   Additional Social History:   Sleep: Good  Appetite:  Good  Current Medications: Current Facility-Administered Medications  Medication Dose Route Frequency Provider Last Rate Last Dose  . acetaminophen (TYLENOL) tablet 650 mg  650 mg Oral Q6H PRN Laverle Hobby, PA-C   650 mg at 10/14/18 1147  . alum & mag hydroxide-simeth (MAALOX/MYLANTA) 200-200-20 MG/5ML suspension 30 mL  30 mL Oral Q4H PRN Patriciaann Clan E, PA-C      . ARIPiprazole (ABILIFY) tablet 2 mg  2 mg Oral Daily Kannon Baum, Myer Peer, MD   2 mg at 10/15/18 0758  . cloNIDine (CATAPRES) tablet 0.1 mg  0.1 mg Oral QID Laverle Hobby, PA-C   0.1 mg at 10/15/18 1202   Followed by  . [START ON 10/16/2018] cloNIDine (CATAPRES) tablet 0.1 mg  0.1 mg Oral BH-qamhs Simon, Spencer E, PA-C       Followed by  . [START ON 10/18/2018] cloNIDine (CATAPRES) tablet 0.1 mg  0.1 mg Oral QAC breakfast Laverle Hobby, PA-C      . dicyclomine (BENTYL) tablet 20 mg  20 mg Oral Q6H PRN Laverle Hobby, PA-C      . diltiazem (CARDIZEM CD) 24 hr capsule 240 mg  240 mg Oral Daily Nadie Fiumara, Myer Peer, MD   240 mg at 10/15/18 0758  . hydrOXYzine (ATARAX/VISTARIL) tablet 25 mg  25 mg Oral Q6H PRN Laverle Hobby, PA-C   25 mg at 10/14/18 0511  . Influenza vac split quadrivalent PF (FLUARIX) injection 0.5 mL  0.5 mL Intramuscular Tomorrow-1000 Page Pucciarelli A, MD      . levothyroxine (SYNTHROID, LEVOTHROID) tablet 50 mcg  50 mcg Oral Q0600 Markeese Boyajian, Myer Peer, MD   50 mcg at 10/15/18 [phone]  . loperamide  (IMODIUM) capsule 2-4 mg  2-4 mg Oral PRN Laverle Hobby, PA-C      . losartan (COZAAR) tablet 50 mg  50 mg Oral Daily Emagene Merfeld, Myer Peer, MD   50 mg at 10/15/18 0757  . magnesium hydroxide (MILK OF MAGNESIA) suspension 30 mL  30 mL Oral Daily PRN Patriciaann Clan E, PA-C      . methocarbamol (ROBAXIN) tablet 500 mg  500 mg Oral Q8H PRN Laverle Hobby, PA-C      .  mirtazapine (REMERON) tablet 7.5 mg  7.5 mg Oral QHS Derry Kassel A, MD   7.5 mg at 10/14/18 2108  . naproxen (NAPROSYN) tablet 500 mg  500 mg Oral BID PRN Laverle Hobby, PA-C   500 mg at 10/15/18 1203  . nicotine (NICODERM CQ - dosed in mg/24 hours) patch 21 mg  21 mg Transdermal Daily Chyrl Elwell, Myer Peer, MD   21 mg at 10/15/18 0757  . Norethindrone-Ethinyl Estradiol-Fe Biphas (LO LOESTRIN FE) 1 MG-10 MCG / 10 MCG tablet 1 tablet  1 tablet Oral Daily Laverle Hobby, PA-C   1 tablet at 10/15/18 0758  . ondansetron (ZOFRAN-ODT) disintegrating tablet 4 mg  4 mg Oral Q6H PRN Laverle Hobby, PA-C      . pneumococcal 23 valent vaccine (PNU-IMMUNE) injection 0.5 mL  0.5 mL Intramuscular Tomorrow-1000 Caylei Sperry, Myer Peer, MD      . venlafaxine XR (EFFEXOR-XR) 24 hr capsule 150 mg  150 mg Oral Q breakfast Regine Christian, Myer Peer, MD   150 mg at 10/15/18 5956    Lab Results:  Results for orders placed or performed during the hospital encounter of 10/14/18 (from the past 48 hour(s))  Lipid panel     Status: Abnormal   Collection Time: 10/15/18  6:24 AM  Result Value Ref Range   Cholesterol 240 (H) 0 - 200 mg/dL   Triglycerides 182 (H) <150 mg/dL   HDL 56 >40 mg/dL   Total CHOL/HDL Ratio 4.3 RATIO   VLDL 36 0 - 40 mg/dL   LDL Cholesterol 148 (H) 0 - 99 mg/dL    Comment:        Total Cholesterol/HDL:CHD Risk Coronary Heart Disease Risk Table                     Men   Women  1/2 Average Risk   3.4   3.3  Average Risk       5.0   4.4  2 X Average Risk   9.6   7.1  3 X Average Risk  23.4   11.0        Use the calculated Patient  Ratio above and the CHD Risk Table to determine the patient's CHD Risk.        ATP III CLASSIFICATION (LDL):  <100     mg/dL   Optimal  100-129  mg/dL   Near or Above                    Optimal  130-159  mg/dL   Borderline  160-189  mg/dL   High  >190     mg/dL   Very High Performed at [facility] [address], [place], [place] [address]   TSH     Status: Abnormal   Collection Time: 10/15/18  6:24 AM  Result Value Ref Range   TSH 4.545 (H) 0.350 - 4.500 uIU/mL    Comment: Performed by a 3rd Generation assay with a functional sensitivity of <=0.01 uIU/mL. Performed at [facility], [place] [address] [place], [place] [address]     Blood Alcohol level:  Lab Results  Component Value Date   [facility] <10 10/13/2018   ETH <5 51/88/4166    Metabolic Disorder Labs: Lab Results  Component Value Date   HGBA1C 5.2 03/03/2014   MPG 103 03/03/2014   No results found for: PROLACTIN Lab Results  Component Value Date   CHOL 240 (H) 10/15/2018   TRIG  182 (H) 10/15/2018   HDL 56 10/15/2018   CHOLHDL 4.3 10/15/2018   VLDL 36 10/15/2018   LDLCALC 148 (H) 10/15/2018    Physical Findings: AIMS: Facial and Oral Movements Muscles of Facial Expression: None, normal Lips and Perioral Area: None, normal Jaw: None, normal Tongue: None, normal,Extremity Movements Upper (arms, wrists, hands, fingers): None, normal Lower (legs, knees, ankles, toes): None, normal, Trunk Movements Neck, shoulders, hips: None, normal, Overall Severity Severity of abnormal movements (highest score from questions above): None, normal Incapacitation due to abnormal movements: None, normal Patient's awareness of abnormal movements (rate only patient's report): No Awareness, Dental Status Current problems with teeth and/or dentures?: No Does patient usually wear dentures?: No  CIWA:  CIWA-Ar Total: 2 COWS:  COWS Total Score: 2  Musculoskeletal: Strength & Muscle Tone: within  normal limits Gait & Station: normal Patient leans: N/A  Psychiatric Specialty Exam: Physical Exam  ROS no headache, no chest pain, no shortness of breath , no vomiting   Blood pressure 125/87, pulse (!) 106, temperature 98 F (36.7 C), resp. rate 20, height '5\' 4"'$  (1.626 m), weight 99.8 kg, last menstrual period 10/09/2018.Body mass index is 37.76 kg/m.  General Appearance: Improving grooming  Eye Contact:  Good  Speech:  Normal Rate  Volume:  Normal  Mood:  Still depressed, ruminative, does describe some improvement compared to how she felt at admission  Affect:  Appropriate and Vaguely constricted but improves during session, smiles at times appropriately  Thought Process:  Linear and Descriptions of Associations: Intact  Orientation:  Full (Time, Place, and Person)  Thought Content:  No hallucinations, no delusions  Suicidal Thoughts:  No denies suicidal or self-injurious ideations, denies homicidal or violent ideations  Homicidal Thoughts:  No  Memory:  Recent and remote grossly intact  Judgement:  Fair-improving  Insight:  Fair  Psychomotor Activity:  Normal-no current psychomotor agitation or restlessness  Concentration:  Concentration: Good and Attention Span: Good  Recall:  Good  Fund of Knowledge:  Good  Language:  Good  Akathisia:  Negative  Handed:  Right  AIMS (if indicated):     Assets:  Communication Skills Desire for Improvement Resilience  ADL's:  Intact  Cognition:  WNL  Sleep:  Number of Hours: 6.75   Assessment: 48 year old female, presented to the hospital voluntarily due to worsening depression, passive suicidal ideations, neurovegetative symptoms.  She states she has chronic depression and that current psychiatric medications (Effexor XR, BuSpar) were no longer working.  Depression became further exacerbated recently after she was fired from her job.  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  Patient describes some improvement compared to how she felt prior to admission.  Affect appears more reactive, smiles at times appropriately, does continue to ruminate and worry about her stressors, mainly recent job loss and current unemployment/financial difficulties.  She does not endorse significant tramadol or opiate withdrawal symptoms at this time and does not appear to be in any acute distress or discomfort.  Similarly, she has tolerated venlafaxine taper well thus far without significant withdrawal symptoms.  Treatment Plan Summary: Daily contact with patient to assess and evaluate symptoms and progress in treatment, Medication management, Plan Inpatient treatment and Medications as below Encourage group and milieu participation to work on coping skills and symptom reduction Continue Abilify 2 mg daily for augmentation/mood Continue clonidine taper  to minimize risk of opiate withdrawal Continue Remeron 7.5 mg nightly for anxiety, depression, insomnia Continue Effexor XR at 150 mg daily at this time-as noted, dose has been tapered down, and will consider further gradual taper to minimize withdrawal symptoms.  Continue Synthroid 50 mcg daily for hypothyroidism Continue losartan/Cardizem for hypertension Treatment team working on disposition planning options Jenne Campus, MD 10/15/2018, 3:07 PM

## 2018-10-15 NOTE — Progress Notes (Signed)
Patient ID: Sydney Glover, female   DOB: [birth year], 48 y.o.   MRN: [id number]  Nursing Progress Note [id number]  On initial approach patient is seen up integrated into the milieu. Patient presents with depressed mood but does brighten during interactions. Patient reports she had nightmares last night but states, "I forgot to take my Nicotine patch off". Patient compliant with scheduled medications. Patient is seen attending groups and visible in the milieu. Patient currently denies SI/HI/AVH. Patient reports a decrease in opiate withdrawal symptoms but is provided clonidine per detox protocol.  Patient is educated about and provided medication per provider's orders. Patient safety maintained with q15 min safety checks and high fall risk precautions. Emotional support given, 1:1 interaction, and active listening provided. Patient encouraged to attend meals, groups, and work on treatment plan and goals. Labs, vital signs and patient behavior monitored throughout shift. COW protocol in place.  Patient contracts for safety with staff. Patient remains safe on the unit at this time and agrees to come to staff with any issues/concerns. Patient is interacting with peers appropriately on the unit. Will continue to support and monitor.    Patient's self-inventory sheet Rated Energy Level  Low  Rated Sleep  Fair  Rated Appetite  Good  Rated Anxiety (0-10)  7-8  Rated Hopelessness (0-10)  3  Rated Depression (0-10)  6-7  Daily Goal  "participate in group and work towards getting used to medications"  Any Additional Comments:

## 2018-10-15 NOTE — [facility] Suicide Risk Assessment (Signed)
[place] INPATIENT:  Family/Significant Other Suicide Prevention Education  Suicide Prevention Education:  Education Completed; daughter, Sydney Glover, [phone] has been identified by the patient as the family member/significant other with whom the patient will be residing, and identified as the person(s) who will aid the patient in the event of a mental health crisis (suicidal ideations/suicide attempt).  With written consent from the patient, the family member/significant other has been provided the following suicide prevention education, prior to the and/or following the discharge of the patient.  The suicide prevention education provided includes the following:  Suicide risk factors  Suicide prevention and interventions  National Suicide Hotline telephone number  [facility] assessment telephone number  [facility] Emergency Assistance [place] and/or Residential Mobile Crisis Unit telephone number  Request made of family/significant other to:  Remove weapons (e.g., guns, rifles, knives), all items previously/currently identified as safety concern.    Remove drugs/medications (over-the-counter, prescriptions, illicit drugs), all items previously/currently identified as a safety concern.  The family member/significant other verbalizes understanding of the suicide prevention education information provided.  The family member/significant other agrees to remove the items of safety concern listed above.  Daughter reports there is a shotgun in the home. Daughter will try to have the gun moved to another location for the time being.   Daughter shares that patient has experienced worsening depression for months. Daughter shares that the patient has also experienced paranoid thoughts, she worries that "everyone hates her." Daughter shares a pattern of the patient has a pattern of being clingy and then pushing people away.  Daughter has some safety concerns, she  shares that the patient can be impulsive. Daughter shares she and her sister have always felt that the patient has bipolar disorder.   Joellen Jersey 10/15/2018, 12:00 PM

## 2018-10-16 DIAGNOSIS — F322 Major depressive disorder, single episode, severe without psychotic features: Secondary | ICD-10-CM | POA: Diagnosis not present

## 2018-10-16 LAB — HEMOGLOBIN A1C
Hgb A1c MFr Bld: 5.3 % (ref 4.8–5.6)
Mean Plasma Glucose: 105 mg/dL

## 2018-10-16 MED ORDER — VENLAFAXINE HCL ER 75 MG PO CP24
75.0000 mg | ORAL_CAPSULE | Freq: Every day | ORAL | Status: DC
  Filled 2018-10-16: qty 1

## 2018-10-16 MED ORDER — VENLAFAXINE HCL ER 75 MG PO CP24
75.0000 mg | ORAL_CAPSULE | Freq: Every day | ORAL | Status: DC
  Administered 2018-10-16 – 2018-10-17 (×2): 75 mg via ORAL
  Filled 2018-10-16 (×4): qty 1

## 2018-10-16 MED ORDER — MIRTAZAPINE 15 MG PO TABS
15.0000 mg | ORAL_TABLET | Freq: Every day | ORAL | Status: DC
  Administered 2018-10-16: 15 mg via ORAL
  Filled 2018-10-16 (×3): qty 1

## 2018-10-16 MED ORDER — FLUTICASONE PROPIONATE 50 MCG/ACT NA SUSP
1.0000 | Freq: Every day | NASAL | Status: DC
  Administered 2018-10-16 – 2018-10-17 (×2): 1 via NASAL
  Filled 2018-10-16 (×2): qty 16

## 2018-10-16 NOTE — Progress Notes (Signed)
[facility] MD Progress Note  10/16/2018 11:36 AM Sydney Glover Sydney Glover  MRN:  [id number] Subjective: Patient reports she is feeling better than she did on admission, although still endorses some depression.  States she had been feeling vaguely dizzy and "foggy" but that these symptoms have improved.  At this time does not endorse medication side effects.  Does endorse some nasal congestion, without other associated symptoms, which she feels may be due to the dry indoor air. Thus far has tolerated Effexor XR taper well, is not endorsing significant venlafaxine withdrawal symptoms, and does not appear to be in any acute distress or discomfort.  Tolerating Remeron trial well.   Objective: I have discussed case with treatment team and have met with patient. 48 year old female, presented to the hospital voluntarily due to worsening depression, passive suicidal ideations, neurovegetative symptoms.  She states she has chronic depression and that current psychiatric medications (Effexor XR, BuSpar) were no longer working.  Depression became further exacerbated recently after she was fired from her job.  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   Patient is presenting with improvement compared to admission presentation.  Her mood is described as improving, although still depressed, her affect presents more reactive.  Today denies suicidal ideations. Thus far tolerating medications well-as noted, patient had reported that Effexor XR was not working well for her, even that high doses/with adequate compliance.  It is now being tapered down, and thus far has not developed any symptoms of venlafaxine withdrawal.  Behavior on unit in good control, visible on unit, pleasant on approach.  Denies suicidal ideations No current significant symptoms of opiate withdrawal-presents  comfortable and in no acute distress or discomfort.  Principal Problem: MDD Diagnosis: Active Problems:   MDD (major depressive disorder), severe ([place])  Total Time spent with patient: 20 minutes   Past Psychiatric History:   Past Medical History:  Past Medical History:  Diagnosis Date  . Anxiety   . Depression   . History of colonic polyps    BENIGN  . Psoriasis   . Tachycardia     Past Surgical History:  Procedure Laterality Date  . ANTERIOR CRUCIATE LIGAMENT REPAIR  2011  . [facility]   X2  . COLPOSCOPY    . TEE WITHOUT CARDIOVERSION N/A 09/19/2017   Procedure: TRANSESOPHAGEAL ECHOCARDIOGRAM (TEE);  Surgeon: Adrian Prows, MD;  Location: [place];  Service: Cardiovascular;  Laterality: N/A;  . TUBAL LIGATION  2007  . TUBAL REVERSAL  2008   Family History:  Family History  Problem Relation Age of Onset  . Hypertension Mother   . Cancer Father 34       COLON  . Diabetes Brother   . Heart attack Brother        1/2 brother-maternal   Family Psychiatric  History:  Social History:  Social History   Substance and Sexual Activity  Alcohol Use Yes   Comment: occ      Social History   Substance and Sexual Activity  Drug Use Yes  . Types: Marijuana   Comment: occ    Social History   Socioeconomic History  . Marital status: Single    Spouse name: Not on file  . Number of children: Not on file  . Years of education: Not on file  . Highest education level: Not on file  Occupational History  . Not on file  Social Needs  . Financial resource strain: Not on file  . Food insecurity:    Worry: Not on file    Inability: Not on file  . Transportation needs:    Medical: Not on file    Non-medical: Not on file  Tobacco Use  . Smoking status: Former Smoker    Packs/day: 0.50    Years: 20.00    Pack years: 10.00    Types: Cigarettes  . Smokeless tobacco: Former Systems developer    Quit date: 11/12/2010  Substance and Sexual Activity  . Alcohol use: Yes     Comment: occ   . Drug use: Yes    Types: Marijuana    Comment: occ  . Sexual activity: Yes    Birth control/protection: Surgical, Pill    Comment: INTERCOUTRSE AGE 69, MORE THAN 5 SEXUAL PARTNERS  Lifestyle  . Physical activity:    Days per week: Not on file    Minutes per session: Not on file  . Stress: Not on file  Relationships  . Social connections:    Talks on phone: Not on file    Gets together: Not on file    Attends religious service: Not on file    Active member of club or organization: Not on file    Attends meetings of clubs or organizations: Not on file    Relationship status: Not on file  Other Topics Concern  . Not on file  Social History Narrative  . Not on file   Additional Social History:   Sleep: Good  Appetite:  Good  Current Medications: Current Facility-Administered Medications  Medication Dose Route Frequency Provider Last Rate Last Dose  . acetaminophen (TYLENOL) tablet 650 mg  650 mg Oral Q6H PRN Laverle Hobby, PA-C   650 mg at 10/15/18 1648  . alum & mag hydroxide-simeth (MAALOX/MYLANTA) 200-200-20 MG/5ML suspension 30 mL  30 mL Oral Q4H PRN Patriciaann Clan E, PA-C      . ARIPiprazole (ABILIFY) tablet 2 mg  2 mg Oral Daily Don Giarrusso, Myer Peer, MD   2 mg at 10/16/18 0824  . cloNIDine (CATAPRES) tablet 0.1 mg  0.1 mg Oral BH-qamhs Patriciaann Clan E, PA-C   0.1 mg at 10/16/18 0825   Followed by  . [START ON 10/18/2018] cloNIDine (CATAPRES) tablet 0.1 mg  0.1 mg Oral QAC breakfast Laverle Hobby, PA-C      . dicyclomine (BENTYL) tablet 20 mg  20 mg Oral Q6H PRN Laverle Hobby, PA-C      . diltiazem (CARDIZEM CD) 24 hr capsule 240 mg  240 mg Oral Daily Ari Engelbrecht, Myer Peer, MD   240 mg at 10/16/18 0824  . fluticasone (FLONASE) 50 MCG/ACT nasal spray 1 spray  1 spray Each Nare Daily Jese Comella, Myer Peer, MD   1 spray at 10/16/18 0827  . hydrOXYzine (ATARAX/VISTARIL) tablet 25 mg  25 mg Oral Q6H PRN Laverle Hobby, PA-C   25 mg at 10/14/18 0511  .  levothyroxine (SYNTHROID, LEVOTHROID) tablet 50 mcg  50 mcg Oral Q0600 Mehek Grega, Myer Peer, MD   50 mcg at 10/16/18 [phone]  . loperamide (IMODIUM) capsule 2-4 mg  2-4 mg Oral PRN Laverle Hobby, PA-C      . losartan (COZAAR) tablet 50 mg  50 mg Oral Daily Arbie Reisz, Myer Peer, MD   50 mg at 10/16/18 0826  . magnesium hydroxide (MILK OF MAGNESIA) suspension 30 mL  30 mL Oral Daily PRN Gilberto Better,  Maurine Minister, PA-C      . methocarbamol (ROBAXIN) tablet 500 mg  500 mg Oral Q8H PRN Laverle Hobby, PA-C      . mirtazapine (REMERON) tablet 15 mg  15 mg Oral QHS Selassie Spatafore A, MD      . naproxen (NAPROSYN) tablet 500 mg  500 mg Oral BID PRN Laverle Hobby, PA-C   500 mg at 10/15/18 2102  . nicotine (NICODERM CQ - dosed in mg/24 hours) patch 21 mg  21 mg Transdermal Daily Iva Montelongo, Myer Peer, MD   21 mg at 10/16/18 0826  . Norethindrone-Ethinyl Estradiol-Fe Biphas (LO LOESTRIN FE) 1 MG-10 MCG / 10 MCG tablet 1 tablet  1 tablet Oral Daily Laverle Hobby, PA-C   1 tablet at 10/16/18 8676  . ondansetron (ZOFRAN-ODT) disintegrating tablet 4 mg  4 mg Oral Q6H PRN Laverle Hobby, PA-C      . venlafaxine XR (EFFEXOR-XR) 24 hr capsule 75 mg  75 mg Oral Q breakfast Markes Shatswell, Myer Peer, MD   75 mg at 10/16/18 1030    Lab Results:  Results for orders placed or performed during the hospital encounter of 10/14/18 (from the past 48 hour(s))  Hemoglobin A1c     Status: None   Collection Time: 10/15/18  6:24 AM  Result Value Ref Range   Hgb A1c MFr Bld 5.3 4.8 - 5.6 %    Comment: (NOTE)         Prediabetes: 5.7 - 6.4         Diabetes: >6.4         Glycemic control for adults with diabetes: <7.0    Mean Plasma Glucose 105 mg/dL    Comment: (NOTE) Performed At: [facility] [place], [place] [id number] Rush Farmer MD [id number]   Lipid panel     Status: Abnormal   Collection Time: 10/15/18  6:24 AM  Result Value Ref Range   Cholesterol 240 (H) 0 - 200 mg/dL   Triglycerides 182 (H)  <150 mg/dL   HDL 56 >40 mg/dL   Total CHOL/HDL Ratio 4.3 RATIO   VLDL 36 0 - 40 mg/dL   LDL Cholesterol 148 (H) 0 - 99 mg/dL    Comment:        Total Cholesterol/HDL:CHD Risk Coronary Heart Disease Risk Table                     Men   Women  1/2 Average Risk   3.4   3.3  Average Risk       5.0   4.4  2 X Average Risk   9.6   7.1  3 X Average Risk  23.4   11.0        Use the calculated Patient Ratio above and the CHD Risk Table to determine the patient's CHD Risk.        ATP III CLASSIFICATION (LDL):  <100     mg/dL   Optimal  100-129  mg/dL   Near or Above                    Optimal  130-159  mg/dL   Borderline  160-189  mg/dL   High  >190     mg/dL   Very High Performed at [facility] [address] [place], [place] [address]   TSH     Status: Abnormal   Collection Time: 10/15/18  6:24 AM  Result Value Ref  Range   TSH 4.545 (H) 0.350 - 4.500 uIU/mL    Comment: Performed by a 3rd Generation assay with a functional sensitivity of <=0.01 uIU/mL. Performed at [facility] [address], [place], [place] [address]     Blood Alcohol level:  Lab Results  Component Value Date   ETH <10 10/13/2018   ETH <5 62/13/0865    Metabolic Disorder Labs: Lab Results  Component Value Date   HGBA1C 5.3 10/15/2018   MPG 105 10/15/2018   MPG 103 03/03/2014   No results found for: PROLACTIN Lab Results  Component Value Date   CHOL 240 (H) 10/15/2018   TRIG 182 (H) 10/15/2018   HDL 56 10/15/2018   CHOLHDL 4.3 10/15/2018   VLDL 36 10/15/2018   LDLCALC 148 (H) 10/15/2018    Physical Findings: AIMS: Facial and Oral Movements Muscles of Facial Expression: None, normal Lips and Perioral Area: None, normal Jaw: None, normal Tongue: None, normal,Extremity Movements Upper (arms, wrists, hands, fingers): None, normal Lower (legs, knees, ankles, toes): None, normal, Trunk Movements Neck, shoulders, hips: None, normal, Overall  Severity Severity of abnormal movements (highest score from questions above): None, normal Incapacitation due to abnormal movements: None, normal Patient's awareness of abnormal movements (rate only patient's report): No Awareness, Dental Status Current problems with teeth and/or dentures?: No Does patient usually wear dentures?: No  CIWA:  CIWA-Ar Total: 2 COWS:  COWS Total Score: 1  Musculoskeletal: Strength & Muscle Tone: within normal limits Gait & Station: normal Patient leans: N/A  Psychiatric Specialty Exam: Physical Exam  ROS no headache, no chest pain, no shortness of breath , no vomiting , reports nasal congestion, no coughing, no shortness of breath, no fever, no chills, no myalgias  Blood pressure 100/70, pulse 68, temperature 98.1 F (36.7 C), temperature source Oral, resp. rate 18, height _0  (1.626 m), weight 99.8 kg, last menstrual period 10/09/2018.Body mass index is 37.76 kg/m.  General Appearance: Improved grooming  Eye Contact:  Good  Speech:  Normal Rate  Volume:  Normal  Mood:  Reports improving mood and does present with partial improvement compared to admission.  Affect:  Affect noted to be more reactive, smiles at times appropriately  Thought Process:  Linear and Descriptions of Associations: Intact  Orientation:  Full (Time, Place, and Person)  Thought Content:  No hallucinations, no delusions  Suicidal Thoughts:  No denies suicidal or self-injurious ideations, denies homicidal or violent ideations  Homicidal Thoughts:  No  Memory:  Recent and remote grossly intact  Judgement:  improving  Insight:  Fair/improving  Psychomotor Activity:  Normal-no current psychomotor agitation or restlessness  Concentration:  Concentration: Good and Attention Span: Good  Recall:  Good  Fund of Knowledge:  Good  Language:  Good  Akathisia:  Negative  Handed:  Right  AIMS (if indicated):     Assets:  Communication Skills Desire for Improvement Resilience  ADL's:   Intact  Cognition:  WNL  Sleep:  Number of Hours: 6.75   Assessment: 48 year old female, presented to the hospital voluntarily due to worsening depression, passive suicidal ideations, neurovegetative symptoms.  She states she has chronic depression and that current psychiatric medications (Effexor XR, BuSpar) were no longer working.  Depression became further exacerbated recently after she was fired from her job.  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  At  this time patient endorses partially improved mood, and does present with a more reactive/full range of affect.  Today denies suicidal ideations.  She has tolerated Effexor XR dose decrease well thus far, without venlafaxine withdrawal symptoms at this time.  Yesterday she did states she felt "dizzy", "foggy" but the symptoms have resolved today.  Thus far she is tolerating Remeron and Abilify well and is interested in continuing this medication regimen.  Treatment Plan Summary: Daily contact with patient to assess and evaluate symptoms and progress in treatment, Medication management, Plan Inpatient treatment and Medications as below  Treatment plan reviewed as below today 2/21 Encourage group and milieu participation to work on coping skills and symptom reduction Continue Abilify 2 mg daily for augmentation/mood Completing Clonidine taper to minimize risk of opiate withdrawal Increase Remeron to 15  mg nightly for anxiety, depression, insomnia Decrease Effexor XR to 75 mg daily at this time-see above  Continue Synthroid 50 mcg daily for hypothyroidism Continue Losartan/Cardizem for hypertension Treatment team working on disposition planning options Jenne Campus, MD 10/16/2018, 11:36 AM   Patient ID: Sydney Glover, female   DOB: Jan 08, 1971, 48 y.o.   MRN: [id number]

## 2018-10-16 NOTE — Plan of Care (Signed)
  Problem: Education: Goal: Emotional status will improve Outcome: Progressing   

## 2018-10-16 NOTE — Progress Notes (Signed)
Patient has been up and active on the unit, attended group this evening and has voiced no complaints. Patient reports looking forward to discharging on tomorrow. Support and encouragement offered, safety maintained on unit, will continue to monitor.

## 2018-10-16 NOTE — BHH Group Notes (Signed)
Adult Psychoeducational Group Note  Date:  10/16/2018 Time:  9:49 PM  Group Topic/Focus:  Wrap-Up Group:   The focus of this group is to help patients review their daily goal of treatment and discuss progress on daily workbooks.  Participation Level:  Active  Participation Quality:  Appropriate and Attentive  Affect:  Appropriate  Cognitive:  Alert and Appropriate  Insight: Appropriate and Good  Engagement in Group:  Engaged  Modes of Intervention:  Discussion and Education  Additional Comments:  Pt attended and participated in wrap up group this evening. Pt rated their day a 9/10, due to them being prepared to go home and being discharged tomorrow. Pt completed their goal, which was to meet with the Dr to get their medications managed.   Sydney Glover 10/16/2018, 9:49 PM

## 2018-10-16 NOTE — Progress Notes (Signed)
D Pt presents to the med window UAL on the 400 hall this am- she tolerates this well. SHe makes good eye contact. She endorses a flat, sad affect, yet she is calm and cooperative with this Probation officer. She is clean, she wears her own clothes. She answers' nurses' assessment questions thoughtfully, explaining each answer, in detail.    A She completed her daily assessment and on this she wrote she denied having SI today and she rated her depression, hopelessness and anxiety " 8/9/0", respectively. She says she wants to " be more productive today" , specifically " communicate in the groups" and that she wants to ' participate'.\    R Safety is in place.

## 2018-10-16 NOTE — Progress Notes (Signed)
Adult Psychoeducational Group Note  Date:  10/16/2018 Time:  11:37 AM  Group Topic/Focus:  Goals Group:   The focus of this group is to help patients establish daily goals to achieve during treatment and discuss how the patient can incorporate goal setting into their daily lives to aide in recovery. Orientation:   The focus of this group is to educate the patient on the purpose and policies of crisis stabilization and provide a format to answer questions about their admission.  The group details unit policies and expectations of patients while admitted.  Participation Level:  Active  Participation Quality:  Appropriate  Affect:  Appropriate  Cognitive:  Alert  Insight: Appropriate  Engagement in Group:  Engaged  Modes of Intervention:  Discussion and Education  Additional Comments:    Pt participated in goals group. Pt's goal today is to work on being active in all group and continue taking her medication.    Lita Mains 10/16/2018, 11:37 AM

## 2018-10-17 DIAGNOSIS — F322 Major depressive disorder, single episode, severe without psychotic features: Secondary | ICD-10-CM | POA: Diagnosis not present

## 2018-10-17 MED ORDER — HYDROXYZINE HCL 25 MG PO TABS: 25.0000 mg | ORAL_TABLET | Freq: Four times a day (QID) | ORAL | 0 refills | Status: DC | PRN

## 2018-10-17 MED ORDER — LEVOTHYROXINE SODIUM 50 MCG PO TABS: 50.0000 ug | ORAL_TABLET | Freq: Every day | ORAL | 0 refills | Status: DC

## 2018-10-17 MED ORDER — ARIPIPRAZOLE 2 MG PO TABS: 2.0000 mg | ORAL_TABLET | Freq: Every day | ORAL | 0 refills | Status: DC

## 2018-10-17 MED ORDER — NICOTINE 21 MG/24HR TD PT24: 21.0000 mg | MEDICATED_PATCH | Freq: Every day | TRANSDERMAL | 0 refills | Status: DC

## 2018-10-17 MED ORDER — VENLAFAXINE HCL ER 75 MG PO CP24: 75.0000 mg | ORAL_CAPSULE | Freq: Every day | ORAL | 0 refills | Status: AC

## 2018-10-17 MED ORDER — DILTIAZEM HCL ER COATED BEADS 240 MG PO CP24: 240.0000 mg | ORAL_CAPSULE | Freq: Every day | ORAL | 0 refills | Status: DC

## 2018-10-17 MED ORDER — MIRTAZAPINE 15 MG PO TABS: 15.0000 mg | ORAL_TABLET | Freq: Every day | ORAL | 0 refills | Status: DC

## 2018-10-17 NOTE — [facility] Suicide Risk Assessment (Signed)
[facility] Discharge Suicide Risk Assessment   Principal Problem:  MDD Discharge Diagnoses: Active Problems:   MDD (major depressive disorder), severe ([place])   Total Time spent with patient: 30 minutes  Musculoskeletal: Strength & Muscle Tone: within normal limits Gait & Station: normal Patient leans: N/A  Psychiatric Specialty Exam: ROS no chest pain, no shortness of breath, no vomiting, no fever or chills , no rash  Blood pressure 122/88, pulse 86, temperature 98.3 F (36.8 C), temperature source Oral, resp. rate 16, height 5\' 4"  (1.626 m), weight 99.8 kg, last menstrual period 10/09/2018.Body mass index is 37.76 kg/m.  General Appearance: Well Groomed  Eye Contact::  Good  Speech:  Normal Rate409  Volume:  Normal  Mood:  improved, currently presents euthymic, states " this is the best I have felt in a while"  Affect:  Appropriate and Full Range  Thought Process:  Linear and Descriptions of Associations: Intact  Orientation:  Full (Time, Place, and Person)  Thought Content:  no hallucinations, no delusions   Suicidal Thoughts:  No denies suicidal or self injurious ideations, no homicidal or violent ideations  Homicidal Thoughts:  No  Memory:  recent and remote grossly intact   Judgement:  Other:  improving   Insight:  improving  Psychomotor Activity:  Normal  Concentration:  Good  Recall:  Good  Fund of Knowledge:Good  Language: Good  Akathisia:  Negative  Handed:  Right  AIMS (if indicated):     Assets:  Communication Skills Desire for Improvement Resilience  Sleep:  Number of Hours: 6.5  Cognition: WNL  ADL's:  Intact   Mental Status Per Nursing Assessment::   On Admission:  NA  Demographic Factors:  20, two children, lives with 56 year old daughter  Loss Factors: Recently lost job  Historical Factors: History of depression, history of a prior psychiatric admission in 2016 for depression and anxiety, history of one prior suicide attempt by overdose, history of  Tramadol Abuse   Risk Reduction Factors:   Responsible for children under 29 years of age, Sense of responsibility to family, Living with another person, especially a relative and Positive coping skills or problem solving skills  Continued Clinical Symptoms:  Alert, attentive, well related, calm, describes mood as much improved and presents euthymic, with a full range of affect, no thought disorder, no suicidal or self injurious ideations, no homicidal or violent ideations, no psychotic symptoms, future oriented, looking forward to returning home and getting a job soon.  No disruptive or agitated behaviors on unit . Denies medication side effects.   Cognitive Features That Contribute To Risk:  No gross cognitive deficits noted upon discharge. Is alert , attentive, and oriented x 3   Suicide Risk:  Mild:  Suicidal ideation of limited frequency, intensity, duration, and specificity.  There are no identifiable plans, no associated intent, mild dysphoria and related symptoms, good self-control (both objective and subjective assessment), few other risk factors, and identifiable protective factors, including available and accessible social support.  Follow-up Information    [facility] Follow up on 10/22/2018.   Why:  Hospital follow up appointment is Thursday, 2/27 at 8:15a. Please bring your photo ID, proof of insurance, current medications, and discharge paperwork from this hospitalization.  Contact information: [address] [place] [address] [phone]           Plan Of Care/Follow-up recommendations:  Activity:  as tolerated  Diet:  heart healthy Tests:  NA Other:  See below  Patient expresses readiness for  discharge and is leaving unit in good spirits  Plans to return home Follow up as above  Follows up with her Cardiologist, Dr. Alice Rieger , in [place] , [place] for medical /cardiac management.     Jenne Campus, MD 10/17/2018, 8:11 AM

## 2018-10-17 NOTE — Discharge Summary (Addendum)
Physician Discharge Summary Note  Patient:  Sydney Glover is an 48 y.o., female MRN:  [id number] DOB:  [birth year] Patient phone:  [phone] (home)  Patient address:   [address] [place] [address],  Total Time spent with patient: 30 minutes  Date of Admission:  10/14/2018 Date of Discharge: 10/17/2018  Reason for Admission:  48 year old female, presented to hospital voluntarily at the encouragement of her daughter and friend. Reports depression and feeling hopeless, which she describes as chronic but worsening over recent weeks, and acutely aggravated by recently being fired from her job . States she had made a comment in confidence to a friend, who then informed HR , resulting in her being fired, and states she has been feeling betrayed. Reports recent passive SI, like " someone please do me a favor and kill me" but denies suicidal plan or intention recently. Endorses neuro-vegetative symptoms as below. She is prescribed Ultram and states she has been taking more than prescribed , up to 6 tablets per day . States she had been off Ultram x 4 days prior to admission, and reports she had been experiencing withdrawal symptoms prior to admission" feeling dizzy and out of it"  Principal Problem: <principal problem not specified> Discharge Diagnoses: Active Problems:   MDD (major depressive disorder), severe [facility])   Past Psychiatric History: one prior psychiatric admission here at [facility] in July 2016. At the time was admitted for worsening depression and anxiety. Was diagnosed with MDD, Panic Disorder. At the time was discharged on Celexa and Trazodone. Reports history of a suicide attempt by overdosing on Ultram about a year ago. States she did not seek inpatient treatment   Past Medical History:  Past Medical History:  Diagnosis Date  . Anxiety   . Depression   . History of colonic polyps    BENIGN  . Psoriasis   . Tachycardia     Past Surgical History:  Procedure  Laterality Date  . ANTERIOR CRUCIATE LIGAMENT REPAIR  2011  . [place]   X2  . COLPOSCOPY    . TEE WITHOUT CARDIOVERSION N/A 09/19/2017   Procedure: TRANSESOPHAGEAL ECHOCARDIOGRAM (TEE);  Surgeon: Adrian Prows, MD;  Location: [facility];  Service: Cardiovascular;  Laterality: N/A;  . TUBAL LIGATION  2007  . TUBAL REVERSAL  2008   Family History:  Family History  Problem Relation Age of Onset  . Hypertension Mother   . Cancer Father 71       COLON  . Diabetes Brother   . Heart attack Brother        1/2 brother-maternal   Family Psychiatric  History: father had history of alcohol dependence and PTSD related to serving in [place], grandmother had history of depression.  Social History:  Social History   Substance and Sexual Activity  Alcohol Use Yes   Comment: occ      Social History   Substance and Sexual Activity  Drug Use Yes  . Types: Marijuana   Comment: occ    Social History   Socioeconomic History  . Marital status: Single    Spouse name: Not on file  . Number of children: Not on file  . Years of education: Not on file  . Highest education level: Not on file  Occupational History  . Not on file  Social Needs  . Financial resource strain: Not on file  . Food insecurity:    Worry: Not on file    Inability: Not on  file  . Transportation needs:    Medical: Not on file    Non-medical: Not on file  Tobacco Use  . Smoking status: Former Smoker    Packs/day: 0.50    Years: 20.00    Pack years: 10.00    Types: Cigarettes  . Smokeless tobacco: Former Systems developer    Quit date: 11/12/2010  Substance and Sexual Activity  . Alcohol use: Yes    Comment: occ   . Drug use: Yes    Types: Marijuana    Comment: occ  . Sexual activity: Yes    Birth control/protection: Surgical, Pill    Comment: INTERCOUTRSE AGE 83, MORE THAN 5 SEXUAL PARTNERS  Lifestyle  . Physical activity:    Days per week: Not on file    Minutes per session: Not on file  .  Stress: Not on file  Relationships  . Social connections:    Talks on phone: Not on file    Gets together: Not on file    Attends religious service: Not on file    Active member of club or organization: Not on file    Attends meetings of clubs or organizations: Not on file    Relationship status: Not on file  Other Topics Concern  . Not on file  Social History Narrative  . Not on file    Hospital Course:  48 year old female,presented to the hospital voluntarily due to worsening depression, passive suicidal ideations, neurovegetative symptoms. She states she has chronic depression and that current psychiatric medications (Effexor XR,BuSpar)were no longer working.Depression became further exacerbated recently after she was fired from her job.She also reports abusing prescribed tramadol,had been using more than 6 tablets/day,reports she last had taken 3 to 4 days prior to admission and was experiencing withdrawal symptoms since then.  After the above admission assessment and during this hospital course, patients presenting symptoms were identified. Labs were reviewed and noted below.   Patient was treated and discharged with the following medications;  Abilify 2 mg daily for augmentation/mood Remeron to 15  mg nightly for anxiety, depression, insomnia Effexor XR to 75 mg daily at this time-see above  Synthroid 50 mcg daily for hypothyroidism Losartan/Cardizem for hypertension  Patient tolerated her treatment regimen without any adverse effects reported. She remained compliant with therapeutic milieu and actively participated in group counseling sessions. While on the unit, patient was able to verbalize learned coping skills for better management of depression and suicidal thoughts and to better maintain these thoughts and symptoms when returning home.  During the course of her hospitalization, Improvement of patients condition was monitored by observation and patients daily report  of symptom reduction, presentation of good affect, and overall improvement in mood & behavior.Upon discharge, patient denied any SI/HI, AVH, delusional thoughts, or paranoia.  Alert, attentive, well related, calm, describes mood as much improved and presents euthymic, with a full range of affect, no thought disorder, no suicidal or self injurious ideations, no homicidal or violent ideations, no psychotic symptoms, future oriented, looking forward to returning home and getting a job soon.  No disruptive or agitated behaviors on unit . Denies medication side effects.   Prior to discharge, patient was assessed and it was determined that she  was both mentally & medically stable to be discharged to continue mental health care on an outpatient basis as noted below. She was provided with all the necessary information needed to make this appointment without problems.She was provided with prescriptions  of her [facility] discharge medications to be  taken to her phamacy. She left [facility] with all personal belongings in no apparent distress. Transportation per patients arrangement.  Physical Findings: AIMS: Facial and Oral Movements Muscles of Facial Expression: None, normal Lips and Perioral Area: None, normal Jaw: None, normal Tongue: None, normal,Extremity Movements Upper (arms, wrists, hands, fingers): None, normal Lower (legs, knees, ankles, toes): None, normal, Trunk Movements Neck, shoulders, hips: None, normal, Overall Severity Severity of abnormal movements (highest score from questions above): None, normal Incapacitation due to abnormal movements: None, normal Patient's awareness of abnormal movements (rate only patient's report): No Awareness, Dental Status Current problems with teeth and/or dentures?: No Does patient usually wear dentures?: No  CIWA:  CIWA-Ar Total: 2 COWS:  COWS Total Score: 1  Musculoskeletal: Strength & Muscle Tone: within normal limits Gait & Station: normal Patient leans:  N/A  Psychiatric Specialty Exam: SEE SRA BY MD  Physical Exam  Nursing note and vitals reviewed. Constitutional: She is oriented to person, place, and time.  Neurological: She is alert and oriented to person, place, and time.    Review of Systems  Psychiatric/Behavioral: Negative for depression (stable), hallucinations, memory loss and substance abuse. Nervous/anxious: stable. Insomnia: stable.   All other systems reviewed and are negative.   Blood pressure 122/88, pulse 86, temperature 98.2 F (36.8 C), resp. rate 16, height 5\' 4"  (1.626 m), weight 99.8 kg, last menstrual period 10/09/2018.Body mass index is 37.76 kg/m.   Have you used any form of tobacco in the last 30 days? (Cigarettes, Smokeless Tobacco, Cigars, and/or Pipes): Yes  Has this patient used any form of tobacco in the last 30 days? (Cigarettes, Smokeless Tobacco, Cigars, and/or Pipes)  Yes, A prescription for an FDA-approved tobacco cessation medication was offered at discharge and the patient refused  Recent Results (from the past 2160 hour(s))  Comprehensive metabolic panel     Status: Abnormal   Collection Time: 10/13/18 10:48 PM  Result Value Ref Range   Sodium 139 135 - 145 mmol/L   Potassium 3.9 3.5 - 5.1 mmol/L   Chloride 106 98 - 111 mmol/L   CO2 25 22 - 32 mmol/L   Glucose, Bld 109 (H) 70 - 99 mg/dL   BUN 15 6 - 20 mg/dL   Creatinine, Ser 0.93 0.44 - 1.00 mg/dL   Calcium 9.6 8.9 - 10.3 mg/dL   Total Protein 7.3 6.5 - 8.1 g/dL   Albumin 4.2 3.5 - 5.0 g/dL   AST 12 (L) 15 - 41 U/L   ALT 11 0 - 44 U/L   Alkaline Phosphatase 53 38 - 126 U/L   Total Bilirubin 0.2 (L) 0.3 - 1.2 mg/dL   GFR calc non Af Amer >60 >60 mL/min   GFR calc Af Amer >60 >60 mL/min   Anion gap 8 5 - 15    Comment: Performed at [facility], [place] [address], [place], [place] [address]  Ethanol     Status: None   Collection Time: 10/13/18 10:48 PM  Result Value Ref Range   Alcohol, Ethyl (B) <10 <10 mg/dL     Comment: (NOTE) Lowest detectable limit for serum alcohol is 10 mg/dL. For medical purposes only. Performed at [facility], [place] [address] [place], [place] [address]   Salicylate level     Status: None   Collection Time: 10/13/18 10:48 PM  Result Value Ref Range   Salicylate Lvl <2.7 2.8 - 30.0 mg/dL    Comment: Performed at [facility], [place]  [address] [place], [place] [address]  Acetaminophen level     Status: Abnormal   Collection Time: 10/13/18 10:48 PM  Result Value Ref Range   Acetaminophen (Tylenol), Serum <10 (L) 10 - 30 ug/mL    Comment: (NOTE) Therapeutic concentrations vary significantly. A range of 10-30 ug/mL  may be an effective concentration for many patients. However, some  are best treated at concentrations outside of this range. Acetaminophen concentrations >150 ug/mL at 4 hours after ingestion  and >50 ug/mL at 12 hours after ingestion are often associated with  toxic reactions. Performed at [facility], [place] [address] [place], [place] [address]   cbc     Status: Abnormal   Collection Time: 10/13/18 10:48 PM  Result Value Ref Range   WBC 12.0 (H) 4.0 - 10.5 K/uL   RBC 4.28 3.87 - 5.11 MIL/uL   Hemoglobin 13.1 12.0 - 15.0 g/dL   HCT 41.5 36.0 - 46.0 %   MCV 97.0 80.0 - 100.0 fL   MCH 30.6 26.0 - 34.0 pg   MCHC 31.6 30.0 - 36.0 g/dL   RDW 12.7 11.5 - 15.5 %   Platelets 306 150 - 400 K/uL   nRBC 0.0 0.0 - 0.2 %    Comment: Performed at [facility], [place] [address] [place], [place] [address]  I-Stat beta hCG blood, ED     Status: None   Collection Time: 10/13/18 10:53 PM  Result Value Ref Range   I-stat hCG, quantitative <5.0 <5 mIU/mL   Comment 3            Comment:   GEST. AGE      CONC.  (mIU/mL)   <=1 WEEK        5 - 50     2 WEEKS       50 - 500     3 WEEKS       100 - 10,000     4 WEEKS     1,000 - 30,000        FEMALE AND NON-PREGNANT FEMALE:     LESS THAN 5  mIU/mL   Hemoglobin A1c     Status: None   Collection Time: 10/15/18  6:24 AM  Result Value Ref Range   Hgb A1c MFr Bld 5.3 4.8 - 5.6 %    Comment: (NOTE)         Prediabetes: 5.7 - 6.4         Diabetes: >6.4         Glycemic control for adults with diabetes: <7.0    Mean Plasma Glucose 105 mg/dL    Comment: (NOTE) Performed At: [facility] [place], [place] [id number] Rush Farmer MD [id number]   Lipid panel     Status: Abnormal   Collection Time: 10/15/18  6:24 AM  Result Value Ref Range   Cholesterol 240 (H) 0 - 200 mg/dL   Triglycerides 182 (H) <150 mg/dL   HDL 56 >40 mg/dL   Total CHOL/HDL Ratio 4.3 RATIO   VLDL 36 0 - 40 mg/dL   LDL Cholesterol 148 (H) 0 - 99 mg/dL    Comment:        Total Cholesterol/HDL:CHD Risk Coronary Heart Disease Risk Table                     Men   Women  1/2 Average Risk   3.4   3.3  Average Risk  5.0   4.4  2 X Average Risk   9.6   7.1  3 X Average Risk  23.4   11.0        Use the calculated Patient Ratio above and the CHD Risk Table to determine the patient's CHD Risk.        ATP III CLASSIFICATION (LDL):  <100     mg/dL   Optimal  100-129  mg/dL   Near or Above                    Optimal  130-159  mg/dL   Borderline  160-189  mg/dL   High  >190     mg/dL   Very High Performed at [place] [address] [place], [place] [address]   TSH     Status: Abnormal   Collection Time: 10/15/18  6:24 AM  Result Value Ref Range   TSH 4.545 (H) 0.350 - 4.500 uIU/mL    Comment: Performed by a 3rd Generation assay with a functional sensitivity of <=0.01 uIU/mL. Performed at [facility], [place] [address], [place], [place] [address]     Blood Alcohol level:  Lab Results  Component Value Date   ETH <10 10/13/2018   ETH <5 27/74/1287    Metabolic Disorder Labs:  Lab Results  Component Value Date   HGBA1C 5.3 10/15/2018   MPG 105 10/15/2018   MPG 103  03/03/2014   No results found for: PROLACTIN Lab Results  Component Value Date   CHOL 240 (H) 10/15/2018   TRIG 182 (H) 10/15/2018   HDL 56 10/15/2018   CHOLHDL 4.3 10/15/2018   VLDL 36 10/15/2018   LDLCALC 148 (H) 10/15/2018    See Psychiatric Specialty Exam and Suicide Risk Assessment completed by Attending Physician prior to discharge.  Discharge destination:  Home  Is patient on multiple antipsychotic therapies at discharge:  No   Has Patient had three or more failed trials of antipsychotic monotherapy by history:  No  Recommended Plan for Multiple Antipsychotic Therapies: NA  Discharge Instructions    Discharge instructions   Complete by:  As directed    Patient is instructed to take all prescribed medications as recommended. Report any side effects or adverse reactions to your outpatient psychiatrist. Patient is instructed to abstain from alcohol and illegal drugs while on prescription medications. In the event of worsening symptoms, patient is instructed to call the crisis hotline, 911, or go to the nearest emergency department for evaluation and treatment.     Allergies as of 10/17/2018      Reactions   Lisinopril Cough      Medication List    STOP taking these medications   ALPRAZolam 0.25 MG tablet Commonly known as:  XANAX   busPIRone 15 MG tablet Commonly known as:  BUSPAR   diltiazem 300 MG 24 hr capsule Commonly known as:  TIAZAC   hydrochlorothiazide 25 MG tablet Commonly known as:  HYDRODIURIL   metroNIDAZOLE 500 MG tablet Commonly known as:  FLAGYL   traMADol 50 MG tablet Commonly known as:  ULTRAM     TAKE these medications     Indication  albuterol 108 (90 Base) MCG/ACT inhaler Commonly known as:  PROVENTIL HFA;VENTOLIN HFA Inhale 2 puffs into the lungs every 4 (four) hours as needed for wheezing or shortness of breath.  Indication:  Asthma   ARIPiprazole 2 MG tablet Commonly known as:  ABILIFY Take 1 tablet (2 mg total)  by mouth  daily. For mood Start taking on:  October 18, 2018  Indication:  Mood   diltiazem 240 MG 24 hr capsule Commonly known as:  CARDIZEM CD Take 1 capsule (240 mg total) by mouth daily. For high blood pressure Start taking on:  October 18, 2018  Indication:  High Blood Pressure Disorder   hydrOXYzine 25 MG tablet Commonly known as:  ATARAX/VISTARIL Take 1 tablet (25 mg total) by mouth every 6 (six) hours as needed for anxiety.  Indication:  Feeling Anxious   levothyroxine 50 MCG tablet Commonly known as:  SYNTHROID, LEVOTHROID Take 1 tablet (50 mcg total) by mouth daily at 6 (six) AM. For underactive thyroid Start taking on:  October 18, 2018 What changed:    when to take this  additional instructions  Indication:  Underactive Thyroid   LO LOESTRIN FE 1 MG-10 MCG / 10 MCG tablet Generic drug:  Norethindrone-Ethinyl Estradiol-Fe Biphas Take 1 tablet by mouth daily.  Indication:  Birth Control Treatment   losartan 50 MG tablet Commonly known as:  COZAAR Take 50 mg by mouth daily.  Indication:  High Blood Pressure Disorder   mirtazapine 15 MG tablet Commonly known as:  REMERON Take 1 tablet (15 mg total) by mouth at bedtime. For mood  Indication:  Mood   nicotine 21 mg/24hr patch Commonly known as:  NICODERM CQ - dosed in mg/24 hours Place 1 patch (21 mg total) onto the skin daily. For smoking cessation Start taking on:  October 18, 2018  Indication:  Nicotine Addiction   ONE-A-DAY WOMENS FORMULA PO Take 1 tablet by mouth daily.  Indication:  Supplementation   polyethylene glycol powder powder Commonly known as:  GLYCOLAX/MIRALAX Take 17 g by mouth once. Dissolve one capful powder into any liquid and drink once a day. You can increase to 2 times a day if unable to have a bowel movement in 3-5 days. What changed:    when to take this  reasons to take this  Indication:  Constipation   venlafaxine XR 75 MG 24 hr capsule Commonly known as:  EFFEXOR-XR Take 1  capsule (75 mg total) by mouth daily with breakfast. For mood Start taking on:  October 18, 2018 What changed:    medication strength  how much to take  when to take this  additional instructions  Another medication with the same name was removed. Continue taking this medication, and follow the directions you see here.  Indication:  Mood      Follow-up Information    [facility] Follow up on 10/22/2018.   Why:  Hospital follow up appointment is Thursday, 2/27 at 8:15a. Please bring your photo ID, proof of insurance, current medications, and discharge paperwork from this hospitalization.  Contact information: [address] [place] [address] [phone]           Follow-up recommendations: Follow up with your outpatient provided for any medical issues. Activity & diet as recommended by your primary care provider.  Comments:  Patient is instructed prior to discharge to: Take all medications as prescribed by his/her mental healthcare provider. Report any adverse effects and or reactions from the medicines to his/her outpatient provider promptly. Patient has been instructed & cautioned: To not engage in alcohol and or illegal drug use while on prescription medicines. In the event of worsening symptoms, patient is instructed to call the crisis hotline, 911 and or go to the nearest ED for appropriate evaluation and treatment of symptoms. To follow-up with his/her  primary care provider for your other medical issues, concerns and or health care needs.  Signed: Mordecai Maes, NP 10/17/2018, 9:08 AM   Patient seen, Suicide Assessment Completed.  Disposition Plan Reviewed

## 2018-10-17 NOTE — [facility] Group Notes (Signed)
LCSW Group Therapy Note  10/17/2018    10:00-11:00am   Type of Therapy and Topic:  Group Therapy: Early Messages Received About Anger  Participation Level:  Active   Description of Group:   In this group, patients shared and discussed the early messages received in their lives about anger through parental or other adult modeling, teaching, repression, punishment, violence, and more.  Participants identified how those childhood lessons influence even now how they usually or often react when angered.  The group discussed that anger is a secondary emotion and what may be the underlying emotional themes that come out through anger outbursts or that are ignored through anger suppression.  Finally, as a group there was a conversation about the workbook's quote that "There is nothing wrong with anger; it is just a sign something needs to change."     Therapeutic Goals: 1. Patients will identify one or more childhood message about anger that they received and how it was taught to them. 2. Patients will discuss how these childhood experiences have influenced and continue to influence their own expression or repression of anger even today. 3. Patients will explore possible primary emotions that tend to fuel their secondary emotion of anger. 4. Patients will learn that anger itself is normal and cannot be eliminated, and that healthier coping skills can assist with resolving conflict rather than worsening situations.  Summary of Patient Progress:  The patient shared that her childhood lessons about anger were continuous because "there was nothing but anger in my house."  Father was an alcoholic and mother drank as well, fought a lot. As a result, she turned to food to comfort herself and does not trust people at all.  She stated that now when she gets angry it is "not pretty" and involves things like fighting and breaking things.  She tries not to self-sooth with food any longer.  Therapeutic Modalities:    Cognitive Behavioral Therapy Motivation Interviewing  Maretta Los  .

## 2018-10-17 NOTE — Progress Notes (Signed)
  [facility] Adult Case Management Discharge Plan :  Will you be returning to the same living situation after discharge:  Yes,  with daughter At discharge, do you have transportation home?: Yes,  denies barriers to getting home Do you have the ability to pay for your medications: No.  No income currently, no insurance, will need assistance from outpatient provider  Release of information consent forms completed and turned in to Medical Records by CSW.   Patient to Follow up at: Follow-up Information    [facility] Follow up on 10/22/2018.   Why:  Hospital follow up appointment is Thursday, 2/27 at 8:15a. Please bring your photo ID, proof of insurance, current medications, and discharge paperwork from this hospitalization.  Contact information: [address] [place] [address] [phone]           Next level of care provider has access to [place] and Suicide Prevention discussed: Yes,  with adult daughter in detail  Have you used any form of tobacco in the last 30 days? (Cigarettes, Smokeless Tobacco, Cigars, and/or Pipes): Yes  Has patient been referred to the Quitline?: Patient refused referral  Patient has been referred for addiction treatment: N/A  Maretta Los, LCSW 10/17/2018, 9:11 AM

## 2018-10-17 NOTE — Progress Notes (Signed)
Adult Psychoeducational Group Note  Date:  10/17/2018 Time:  9:05am-10:05am   Group Topic/Focus:  Goals Group:   The focus of this group is to help patients establish daily goals to achieve during treatment and discuss how the patient can incorporate goal setting into their daily lives to aide in recovery.  Participation Level:  Active  Participation Quality:  Appropriate, Attentive and Supportive  Affect:  Appropriate  Cognitive:  Alert, Appropriate and Oriented  Insight: Appropriate  Engagement in Group:  Supportive  Modes of Intervention:  Discussion and Support  Additional Comments:  Patient informed the group that her goal for the day was to get everything straight to go home. Patient informed the group that she would achieve this pack her things. Patient informed the group that he would rate his day at a 9 or 10. Patient informed the group that she looks forward to going home and seeing her dog and feeling a sense of what is normal. Patient informed the group that she has learned that you think you have to be strong, but asking for help doesn't mean you are not are not strong.   Sydney Glover 10/17/2018, 4:49 PM

## 2018-10-17 NOTE — Progress Notes (Signed)
Pt copleted daily assessment and on this she wrote she denied SI today and she rated her depression, hopelessness and anxiety "2/0/2", respectively. Hr dc instructions were given to her by VR RN, she stated verbal understandign and she was given cc of these instructions ( SRA, AVS, SSP and trnasition record). All belongings are returned to her and she was escorted to bldg entrance and dc;d.

## 2018-11-03 ENCOUNTER — Other Ambulatory Visit: Payer: Self-pay | Admitting: Women's Health

## 2018-11-03 ENCOUNTER — Telehealth: Payer: Self-pay | Admitting: *Deleted

## 2018-11-03 MED ORDER — LOSARTAN POTASSIUM 25 MG PO TABS: 50.0000 mg | ORAL_TABLET | Freq: Every day | ORAL | 3 refills | Status: DC

## 2018-11-03 MED ORDER — METRONIDAZOLE 500 MG PO TABS: 500.0000 mg | ORAL_TABLET | Freq: Two times a day (BID) | ORAL | 0 refills | Status: DC

## 2018-11-03 NOTE — Telephone Encounter (Signed)
Telephone call, states has had BV in the past same symptoms of vaginal odor without itching, same partner, struggling with anxiety and depression currently in therapy several times weekly as well as looking for a job.  No insurance.  Reviewed normally do not call in medication for BV but will this 1 time only, office visit if no relief.  Avoid alcohol while taking.

## 2018-11-03 NOTE — Telephone Encounter (Signed)
Patient called c/o vaginal odor only, aware OV for treatment, doesn't have insurance at the time. Asked if you would be will to send in Rx for BV? Had BV in past. Please advise

## 2018-11-03 NOTE — Telephone Encounter (Signed)
Message left

## 2018-11-03 NOTE — Telephone Encounter (Signed)
Flagyl 500 twice daily for 7 days Rx E scribed to [facility].

## 2018-11-14 ENCOUNTER — Other Ambulatory Visit: Payer: Self-pay | Admitting: Women's Health

## 2018-11-17 ENCOUNTER — Other Ambulatory Visit: Payer: Self-pay

## 2018-11-17 MED ORDER — LOSARTAN POTASSIUM 25 MG PO TABS: 50.0000 mg | ORAL_TABLET | Freq: Every day | ORAL | 1 refills | Status: DC

## 2018-11-17 MED ORDER — DILTIAZEM HCL ER COATED BEADS 240 MG PO CP24: 240.0000 mg | ORAL_CAPSULE | Freq: Every day | ORAL | 1 refills | Status: DC

## 2019-01-23 ENCOUNTER — Other Ambulatory Visit: Payer: Self-pay | Admitting: Cardiology

## 2019-01-23 DIAGNOSIS — I10 Essential (primary) hypertension: Secondary | ICD-10-CM

## 2019-01-25 NOTE — Telephone Encounter (Signed)
When did I last see her

## 2019-01-25 NOTE — Telephone Encounter (Signed)
Please fill

## 2019-01-27 NOTE — Telephone Encounter (Signed)
So I am not seeing her back then needs to go to PCP

## 2019-01-27 NOTE — Telephone Encounter (Signed)
09/23/2017

## 2019-03-07 ENCOUNTER — Other Ambulatory Visit: Payer: Self-pay | Admitting: Cardiology

## 2019-06-10 ENCOUNTER — Other Ambulatory Visit: Payer: Self-pay | Admitting: Cardiology

## 2019-09-08 ENCOUNTER — Other Ambulatory Visit: Payer: Self-pay | Admitting: Cardiology

## 2019-12-04 ENCOUNTER — Other Ambulatory Visit: Payer: Self-pay | Admitting: Cardiology

## 2021-01-05 ENCOUNTER — Ambulatory Visit: Payer: 59 | Admitting: Nurse Practitioner

## 2021-01-08 NOTE — Progress Notes (Signed)
GYNECOLOGY  VISIT  CC:   Vaginal odor, itching, and discharge x 3 days  HPI: 50 y.o. [id number] [name] here for vaginal discharge, itching & odor.  New sexual  partner since January, would like STD testing. Last annual exam 2019. Last pap 2018  Period Duration (Days): 5-7 Period Pattern: Regular Menstrual Flow:  (moderate to heavy) Menstrual Control: Tampon Dysmenorrhea: None  GYNECOLOGIC HISTORY: Patient's last menstrual period was 12/12/2020 (exact date). Contraception: btl Menopausal hormone therapy: none  Patient Active Problem List   Diagnosis Date Noted  . MDD (major depressive disorder), severe ([place]) 10/14/2018  . Shortness of breath 09/14/2017  . Murmur 09/14/2017  . Major depressive disorder, recurrent, severe without psychotic features ([place])   . Panic disorder with agoraphobia 03/15/2015  . MDD (major depressive disorder), recurrent episode, severe ([place]) 03/14/2015  . Anxiety and depression 11/11/2012  . Personal history of colonic polyps 11/11/2012    Past Medical History:  Diagnosis Date  . Anxiety   . Depression   . History of colonic polyps    BENIGN  . Psoriasis   . Tachycardia     Past Surgical History:  Procedure Laterality Date  . ANTERIOR CRUCIATE LIGAMENT REPAIR  2011  . [place]   X2  . COLPOSCOPY    . TEE WITHOUT CARDIOVERSION N/A 09/19/2017   Procedure: TRANSESOPHAGEAL ECHOCARDIOGRAM (TEE);  Surgeon: Adrian Prows, MD;  Location: [facility];  Service: Cardiovascular;  Laterality: N/A;  . TUBAL LIGATION  2007  . TUBAL REVERSAL  2008    MEDS:   Current Outpatient Medications on File Prior to Visit  Medication Sig Dispense Refill  . albuterol (PROVENTIL HFA;VENTOLIN HFA) 108 (90 BASE) MCG/ACT inhaler Inhale 2 puffs into the lungs every 4 (four) hours as needed for wheezing or shortness of breath. 1 Inhaler 0  . ARIPiprazole (ABILIFY) 10 MG tablet Take 10 mg by mouth daily.    Marland Kitchen atomoxetine  (STRATTERA) 80 MG capsule Take 80 mg by mouth daily.    . hydrOXYzine (ATARAX/VISTARIL) 25 MG tablet Take 1 tablet (25 mg total) by mouth every 6 (six) hours as needed for anxiety. 60 tablet 0  . venlafaxine XR (EFFEXOR-XR) 37.5 MG 24 hr capsule Take by mouth.    . venlafaxine XR (EFFEXOR-XR) 75 MG 24 hr capsule Take 1 capsule (75 mg total) by mouth daily with breakfast. For mood 30 capsule 0   No current facility-administered medications on file prior to visit.    ALLERGIES: Lisinopril  Family History  Problem Relation Age of Onset  . Hypertension Mother   . Cancer Father 34       COLON  . Diabetes Brother   . Heart attack Brother        1/2 brother-maternal     Review of Systems  Constitutional: Negative.   HENT: Negative.   Eyes: Negative.   Respiratory: Negative.   Cardiovascular: Negative.   Gastrointestinal: Negative.   Endocrine: Negative.   Genitourinary:       Vaginal itching, discharge & odor  Musculoskeletal: Negative.   Skin: Negative.   Allergic/Immunologic: Negative.   Neurological: Negative.   Hematological: Negative.   Psychiatric/Behavioral: Negative.     PHYSICAL EXAMINATION:    BP 106/70   Pulse 81   Resp 16   Wt 239 lb (108.4 kg)   LMP 12/12/2020 (Exact Date)   BMI 41.02 kg/m     General appearance: alert, cooperative, no acute distress Skin: small red  plaques c/w psoriasis Abdomen: soft, non-tender; no masses,  no organomegaly Lymph:  no inguinal LAD noted  Pelvic: External genitalia:  2 genital warts noted on vulva one on each labia majora              Urethra:  normal appearing urethra with no masses, tenderness or lesions              Bartholins and Skenes: normal                 Vagina: normal appearing vagina with increased amount white discharge, ph 5.0              Cervix: no cervical motion tenderness              Bimanual Exam:  Uterus:  enlarged, 8 weeks size, irregular, firm and non tender              Adnexa: no mass,  fullness, tenderness               Chaperone, Joy, CMA, was present for exam.  Assessment/Plan: Screen for STD (sexually transmitted disease) - Plan: WET PREP FOR [place], YEAST, CLUE, C. trachomatis/N. gonorrhoeae RNA  Psoriasis - Plan: desonide (DESOWEN) 0.05 % cream  Genital warts - Plan: imiquimod (ALDARA) 5 % cream  BV (bacterial vaginosis) - Plan: metroNIDAZOLE (FLAGYL) 500 MG tablet  Encouraged to follow up for annual exam   25 minutes of total time was spent for this patient encounter, including preparation, face-to-face counseling with the patient and coordination of care, and documentation of the encounter.

## 2021-01-09 ENCOUNTER — Encounter: Payer: Self-pay | Admitting: Nurse Practitioner

## 2021-01-09 ENCOUNTER — Other Ambulatory Visit: Payer: Self-pay

## 2021-01-09 ENCOUNTER — Ambulatory Visit (INDEPENDENT_AMBULATORY_CARE_PROVIDER_SITE_OTHER): Payer: Self-pay | Admitting: Nurse Practitioner

## 2021-01-09 VITALS — BP 106/70 | HR 81 | Resp 16 | Wt 239.0 lb

## 2021-01-09 DIAGNOSIS — B9689 Other specified bacterial agents as the cause of diseases classified elsewhere: Secondary | ICD-10-CM

## 2021-01-09 DIAGNOSIS — L409 Psoriasis, unspecified: Secondary | ICD-10-CM

## 2021-01-09 DIAGNOSIS — Z113 Encounter for screening for infections with a predominantly sexual mode of transmission: Secondary | ICD-10-CM

## 2021-01-09 DIAGNOSIS — A630 Anogenital (venereal) warts: Secondary | ICD-10-CM

## 2021-01-09 DIAGNOSIS — N760 Acute vaginitis: Secondary | ICD-10-CM

## 2021-01-09 LAB — WET PREP FOR TRICH, YEAST, CLUE

## 2021-01-09 MED ORDER — DESONIDE 0.05 % EX CREA: TOPICAL_CREAM | Freq: Two times a day (BID) | CUTANEOUS | 0 refills | Status: AC

## 2021-01-09 MED ORDER — METRONIDAZOLE 500 MG PO TABS: 500.0000 mg | ORAL_TABLET | Freq: Two times a day (BID) | ORAL | 0 refills | Status: DC

## 2021-01-09 MED ORDER — IMIQUIMOD 5 % EX CREA: TOPICAL_CREAM | CUTANEOUS | 2 refills | Status: DC

## 2021-01-09 NOTE — Patient Instructions (Addendum)
Bacterial Vaginosis  Bacterial vaginosis is an infection of the vagina. It happens when too many normal germs (healthy bacteria) grow in the vagina. This infection can make it easier to get other infections from sex (STIs). It is very important for pregnant women to get treated. This infection can cause babies to be born early or at a low birth weight. What are the causes? This infection is caused by an increase in certain germs that grow in the vagina. You cannot get this infection from toilet seats, bedsheets, swimming pools, or things that touch your vagina. What increases the risk?  Having sex with a new person or more than one person.  Having sex without protection.  Douching.  Having an intrauterine device (IUD).  Smoking.  Using drugs or drinking alcohol. These can lead you to do things that are risky.  Taking certain antibiotic medicines.  Being pregnant. What are the signs or symptoms? Some women have no symptoms. Symptoms may include:  A discharge from your vagina. It may be gray or white. It can be watery or foamy.  A fishy smell. This can happen after sex or during your menstrual period.  Itching in and around your vagina.  A feeling of burning or pain when you pee (urinate). How is this treated? This infection is treated with antibiotic medicines. These may be given to you as:  A pill.  A cream for your vagina.  A medicine that you put into your vagina (suppository). If the infection comes back after treatment, you may need more antibiotics. Follow these instructions at home: Medicines  Take over-the-counter and prescription medicines as told by your doctor.  Take or use your antibiotic medicine as told by your doctor. Do not stop taking or using it, even if you start to feel better. General instructions  If the person you have sex with is a woman, tell her that you have this infection. She will need to follow up with her doctor. If you have a female  partner, he does not need to be treated.  Do not have sex until you finish treatment.  Drink enough fluid to keep your pee pale yellow.  Keep your vagina and butt clean. ? Wash the area with warm water each day. ? Wipe from front to back after you use the toilet.  If you are breastfeeding a baby, ask your doctor if you should keep doing so during treatment.  Keep all follow-up visits. How is this prevented? Self-care  Do not douche.  Use only warm water to wash around your vagina.  Wear underwear that is cotton or lined with cotton.  Do not wear tight pants and pantyhose, especially in the summer. Safe sex  Use protection when you have sex. This includes: ? Use condoms. ? Use dental dams. This is a thin layer that protects the mouth during oral sex.  Limit how many people you have sex with. To prevent this infection, it is best to have sex with just one person.  Get tested for STIs. The person you have sex with should also get tested. Drugs and alcohol  Do not smoke or use any products that contain nicotine or tobacco. If you need help quitting, ask your doctor.  Do not use drugs.  Do not drink alcohol if: ? Your doctor tells you not to drink. ? You are pregnant, may be pregnant, or are planning to become pregnant.  If you drink alcohol: ? Limit how much you have to 0-1 drink   a day. ? Know how much alcohol is in your drink. In the [place] one drink equals one 12 oz bottle of beer (355 mL), one 5 oz glass of wine (148 mL), or one 1 oz glass of hard liquor (44 mL). Where to find more information  Centers for Disease Control and Prevention: http://www.wolf.info/  American Sexual Health Association: www.ashastd.org  Office on [employer]: [place].tn Contact a doctor if:  Your symptoms do not get better, even after you are treated.  You have more discharge or pain when you pee.  You have a fever or chills.  You have pain in your belly (abdomen) or in the area  between your hips.  You have pain with sex.  You bleed from your vagina between menstrual periods. Summary  This infection can happen when too many germs (bacteria) grow in the vagina.  This infection can make it easier to get infections from sex (STIs). Treating this can lower that chance.  Get treated if you are pregnant. This infection can cause babies to be born early.  Do not stop taking or using your antibiotic medicine, even if you start to feel better. This information is not intended to replace advice given to you by your health care provider. Make sure you discuss any questions you have with your health care provider. Document Revised: 02/10/2020 Document Reviewed: 02/10/2020 Elsevier Patient Education  2021 [place].   Genital Warts  Genital warts are a common STI (sexually transmitted infection). They are small warts in the area around the genitals or the opening of the butt (anus). They sometimes cause pain. Genital warts are easily passed to other people through sex. Many people do not know that they are infected. They may be infected for years without symptoms. Even without symptoms, they can pass the infection to their sex partners. What are the causes? Genital warts are caused by a virus called HPV. HPV spreads from person to person during sex. It can be spread through vaginal, anal, and oral sex. What increases the risk?  Having sex without using a condom.  Having sex with many people.  Having sex before the age of 56.  Being a female who is not circumcised.  Having a female sex partner who is not circumcised.  Having a weak body defense system (immune system). What are the signs or symptoms?  Small warts in the area around the genitals or the opening of the butt. These warts often grow in clusters.  Itching and irritation in the genital area or the area around the opening of the butt.  Bleeding from the warts.  Pain during sex. How is this  treated?  Medicines, such as creams, that are put on the skin.  Procedures, such as: ? Freezing the warts. ? Burning the warts. ? Having surgery to get rid of the warts. Getting treatment is important because genital warts can lead to other problems. In females, the virus that causes the warts may increase the risk for cancer of the cervix. Follow these instructions at home: Medicines  Apply over-the-counter and prescription medicines only as told by your doctor.  Do not use medicines that are meant for treating hand warts.  Talk with your doctor about using creams to treat itching.   Instructions for women  Get regular tests to check for cancer of the cervix. This type of cancer grows slowly and can almost always be cured if it is found early.  If you become pregnant, tell your doctor that  you have had genital warts. General instructions  Do not touch or scratch the warts.  Do not have sex until your treatment is done.  Tell your current and past sex partners about your condition. They may need treatment.  After treatment, use condoms during sex.  Keep all follow-up visits as told by your doctor. This is important. How is this prevented? Talk with your doctor about getting the HPV shot. The HPV shot:  Can help stop some HPV infections and cancers.  Is given to males and females who are 41-11 years old.  Is not recommended for pregnant women.  Will not work if you already have HPV. Contact a doctor if:  You have redness, swelling, or pain in the area of the treated skin.  You have a fever.  You feel sick.  You have lumps or have bleeding in the area around your genitals or the opening of your butt.  You have pain during sex.  You have bleeding after sex. Summary  Genital warts are a common STI. They are small warts in the area around the genitals or the opening of the butt (anus).  A virus called HPV causes genital warts.  The virus is spread by having  sex without using a condom.  Getting treatment is important.  This condition is treated using medicines. Freezing, burning, or surgery may be done to get rid of the warts. This information is not intended to replace advice given to you by your health care provider. Make sure you discuss any questions you have with your health care provider. Document Revised: 06/24/2019 Document Reviewed: 06/24/2019 Elsevier Patient Education  [place].

## 2021-01-10 LAB — C. TRACHOMATIS/N. GONORRHOEAE RNA
C. trachomatis RNA, TMA: NOT DETECTED
N. gonorrhoeae RNA, TMA: NOT DETECTED

## 2021-10-02 NOTE — Progress Notes (Signed)
Sydney Glover [birth year] [id number]   History:  51 y.o. [id number] presents for annual exam. She has started having irregular menses, which can be frustrating for her. She is skipping months and then bleeding is slightly heavier than normal. Denies menopausal symptoms. Normal pap history. Anxiety, depression, HTN (not currently medicated) managed by PCP.   Gynecologic History Patient's last menstrual period was 09/05/2021. Period Cycle (Days): 28 (Occas skip a month) Period Duration (Days): 5 Period Pattern: Regular Menstrual Flow: Heavy, Moderate Dysmenorrhea: None Contraception/Family planning: none Sexually active: Yes  Health Maintenance Last Pap: 04/30/2017. Results were: Normal Last mammogram: 06/2021. Results were: Normal Last colonoscopy: 2011. Results were: Normal. Scheduled this month Last Dexa: Not indicated  Past medical history, past surgical history, family history and social history were all reviewed and documented in the EPIC chart. Married. 28 yo daughter, has 43 month old son. 28 yo daughter. Works in Insurance underwriter for Conservation officer, historic buildings. Father deceased from colon cancer at age 79.   ROS:  A ROS was performed and pertinent positives and negatives are included.  Exam:  Vitals:   10/03/21 0924  BP: (!) 142/86  Weight: 240 lb (108.9 kg)  Height: 5\' 4"  (1.626 m)   Body mass index is 41.2 kg/m.  General appearance:  Normal Thyroid:  Symmetrical, normal in size, without palpable masses or nodularity. Respiratory  Auscultation:  Clear without wheezing or rhonchi Cardiovascular  Auscultation:  Regular rate, without rubs, murmurs or gallops  Edema/varicosities:  Not grossly evident Abdominal  Soft,nontender, without masses, guarding or rebound.  Liver/spleen:  No organomegaly noted  Hernia:  None appreciated  Skin  Inspection:  Grossly normal Breasts: Examined lying and sitting.   Right: Without masses, retractions, nipple discharge or axillary  adenopathy.   Left: Without masses, retractions, nipple discharge or axillary adenopathy. Genitourinary   Inguinal/mons:  Normal without inguinal adenopathy  External genitalia:  Normal appearing vulva with no masses, tenderness, or lesions  BUS/Urethra/Skene's glands:  Normal  Vagina:  Normal appearing with normal color and discharge, no lesions. Menstrual blood present  Cervix:  Normal appearing without discharge or lesions  Uterus:  Normal in size, shape and contour.  Midline and mobile, nontender  Adnexa/parametria:     Rt: Normal in size, without masses or tenderness.   Lt: Normal in size, without masses or tenderness.  Anus and perineum: Normal  Digital rectal exam: Deferred  Patient informed chaperone available to be present for breast and pelvic exam. Patient has requested no chaperone to be present. Patient has been advised what will be completed during breast and pelvic exam.   Assessment/Plan:  51 y.o. [id number] for annual exam.   Well female exam with routine gynecological exam - Plan: Cytology - PAP( [place]). Education provided on SBEs, importance of preventative screenings, current guidelines, high calcium diet, regular exercise, and multivitamin daily.  Labs with PCP.   Encounter for initial prescription of contraceptive pills - Plan: norethindrone-ethinyl estradiol-FE (LOESTRIN FE) 1-20 MG-MCG tablet daily. Aware of proper use. Refill x 1 year provided. She is on her menses today, so she can start now. BP slightly elevated today. Recommend monitoring initially.   Perimenopause - has began having irregular menses, occasionally skipping months. Bleeding is heavier when missed. Bleeding is frustrating to her and she would like to restart OCPs for management. Denies menopausal symptoms.   Screening for cervical cancer - Normal Pap history.  Pap today.   Screening for breast cancer - Normal mammogram history.  Continue annual screenings.  Normal breast exam  today.  Screening for colon cancer - 2011 colonoscopy. Scheduled this month. Father deceased from colon cancer.  Return in 1 year for annual.     Tamela Gammon DNP, 9:56 AM 10/03/2021

## 2021-10-03 ENCOUNTER — Other Ambulatory Visit: Payer: Self-pay

## 2021-10-03 ENCOUNTER — Encounter: Payer: Self-pay | Admitting: Nurse Practitioner

## 2021-10-03 ENCOUNTER — Other Ambulatory Visit (HOSPITAL_COMMUNITY)
Admission: RE | Admit: 2021-10-03 | Discharge: 2021-10-03 | Disposition: A | Discharge status: Home / Self Care | Payer: Self-pay | Source: Ambulatory Visit | Attending: Nurse Practitioner | Admitting: Nurse Practitioner

## 2021-10-03 ENCOUNTER — Ambulatory Visit (INDEPENDENT_AMBULATORY_CARE_PROVIDER_SITE_OTHER): Payer: 59 | Admitting: Nurse Practitioner

## 2021-10-03 VITALS — BP 142/86 | Ht 64.0 in | Wt 240.0 lb

## 2021-10-03 DIAGNOSIS — Z01419 Encounter for gynecological examination (general) (routine) without abnormal findings: Secondary | ICD-10-CM

## 2021-10-03 DIAGNOSIS — Z30011 Encounter for initial prescription of contraceptive pills: Secondary | ICD-10-CM | POA: Diagnosis not present

## 2021-10-03 DIAGNOSIS — N951 Menopausal and female climacteric states: Secondary | ICD-10-CM | POA: Diagnosis not present

## 2021-10-03 MED ORDER — NORETHIN ACE-ETH ESTRAD-FE 1-20 MG-MCG PO TABS: 1.0000 | ORAL_TABLET | Freq: Every day | ORAL | 4 refills | Status: DC

## 2021-10-04 LAB — CYTOLOGY - PAP
Comment: NEGATIVE
Diagnosis: NEGATIVE
High risk HPV: NEGATIVE

## 2022-05-24 ENCOUNTER — Other Ambulatory Visit (HOSPITAL_BASED_OUTPATIENT_CLINIC_OR_DEPARTMENT_OTHER): Payer: Self-pay | Admitting: Family Medicine

## 2022-05-24 ENCOUNTER — Ambulatory Visit (HOSPITAL_BASED_OUTPATIENT_CLINIC_OR_DEPARTMENT_OTHER)
Admission: RE | Admit: 2022-05-24 | Discharge: 2022-05-24 | Disposition: A | Discharge status: Home / Self Care | Payer: 59 | Source: Ambulatory Visit | Attending: Family Medicine | Admitting: Family Medicine

## 2022-05-24 DIAGNOSIS — M79604 Pain in right leg: Secondary | ICD-10-CM | POA: Diagnosis not present

## 2022-10-10 DIAGNOSIS — Z6841 Body Mass Index (BMI) 40.0 and over, adult: Secondary | ICD-10-CM | POA: Diagnosis not present

## 2022-10-10 DIAGNOSIS — E039 Hypothyroidism, unspecified: Secondary | ICD-10-CM | POA: Diagnosis not present

## 2022-10-10 DIAGNOSIS — Z0000 Encounter for general adult medical examination without abnormal findings: Secondary | ICD-10-CM | POA: Diagnosis not present

## 2022-10-10 DIAGNOSIS — F319 Bipolar disorder, unspecified: Secondary | ICD-10-CM | POA: Diagnosis not present

## 2022-10-10 DIAGNOSIS — F900 Attention-deficit hyperactivity disorder, predominantly inattentive type: Secondary | ICD-10-CM | POA: Diagnosis not present

## 2022-10-10 DIAGNOSIS — E782 Mixed hyperlipidemia: Secondary | ICD-10-CM | POA: Diagnosis not present

## 2022-10-16 ENCOUNTER — Other Ambulatory Visit: Payer: Self-pay | Admitting: Nurse Practitioner

## 2022-10-16 DIAGNOSIS — Z30011 Encounter for initial prescription of contraceptive pills: Secondary | ICD-10-CM

## 2022-10-16 NOTE — Telephone Encounter (Signed)
Melton Alar, CMA Left message for patient to call and schedule appointment.

## 2022-10-16 NOTE — Telephone Encounter (Signed)
Last AEX 10/03/2021--nothing scheduled. Recall was sent per EMR.  Last mammo seen 07/16/2021-neg birads 1  Last refill per pharmacy on 08/25/2022 for #84  Will send msg to appt desk to contact pt to schedule annual.

## 2022-10-21 NOTE — Telephone Encounter (Signed)
Last AEX 10/03/2021--nothing scheduled. Recall was sent per EMR.   Last mammo seen 07/16/2021-neg birads 1    AEX scheduled 12/18/22.

## 2022-10-25 DIAGNOSIS — Z1231 Encounter for screening mammogram for malignant neoplasm of breast: Secondary | ICD-10-CM | POA: Diagnosis not present

## 2022-12-12 ENCOUNTER — Encounter: Payer: Self-pay | Admitting: Nurse Practitioner

## 2022-12-18 ENCOUNTER — Encounter: Payer: Self-pay | Admitting: Nurse Practitioner

## 2022-12-18 ENCOUNTER — Ambulatory Visit (INDEPENDENT_AMBULATORY_CARE_PROVIDER_SITE_OTHER): Payer: BC Managed Care – PPO | Admitting: Nurse Practitioner

## 2022-12-18 VITALS — BP 118/84 | HR 86 | Ht 65.0 in | Wt 240.0 lb

## 2022-12-18 DIAGNOSIS — Z01419 Encounter for gynecological examination (general) (routine) without abnormal findings: Secondary | ICD-10-CM

## 2022-12-18 DIAGNOSIS — N951 Menopausal and female climacteric states: Secondary | ICD-10-CM | POA: Diagnosis not present

## 2022-12-18 DIAGNOSIS — Z113 Encounter for screening for infections with a predominantly sexual mode of transmission: Secondary | ICD-10-CM

## 2022-12-18 DIAGNOSIS — Z3041 Encounter for surveillance of contraceptive pills: Secondary | ICD-10-CM

## 2022-12-18 MED ORDER — NORETHINDRONE 0.35 MG PO TABS: 1.0000 | ORAL_TABLET | Freq: Every day | ORAL | 3 refills | Status: DC

## 2022-12-18 NOTE — Progress Notes (Signed)
Sydney Glover [birth year] [id number]   History:  52 y.o. [id number] presents for annual exam. Started on OCPs last year due to irregular menses s/t perimenopause. Has not had withdrawal bleed for months. Denies menopausal symptoms. On Xarelto for DVT in October 2023, reports no one told her to discontinue COCs. She is really wanting to continue OCPs for bleeding management. Normal pap history. Anxiety, depression, HTN managed by PCP.   Gynecologic History No LMP recorded. (Menstrual status: Oral contraceptives).   Contraception/Family planning: none Sexually active: Yes, would like STD screening today  Health Maintenance Last Pap: 10/03/2021. Results were: Normal neg HPV, 5-year repeat Last mammogram: 10/25/2022. Results were: Normal Last colonoscopy: 10/19/2021, 5-year recall Last Dexa: Not indicated  Past medical history, past surgical history, family history and social history were all reviewed and documented in the EPIC chart. 47 yo daughter, has 36 yo old son. 41 yo daughter. 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 Father deceased from colon cancer at age 35.   ROS:  A ROS was performed and pertinent positives and negatives are included.  Exam:  Vitals:   12/18/22 0909  BP: 118/84  Pulse: 86  SpO2: 98%  Weight: 240 lb (108.9 kg)  Height:  (1.651 m)    Body mass index is 39.94 kg/m.  General appearance:  Normal Thyroid:  Symmetrical, normal in size, without palpable masses or nodularity. Respiratory  Auscultation:  Clear without wheezing or rhonchi Cardiovascular  Auscultation:  Regular rate, without rubs, murmurs or gallops  Edema/varicosities:  Not grossly evident Abdominal  Soft,nontender, without masses, guarding or rebound.  Liver/spleen:  No organomegaly noted  Hernia:  None appreciated  Skin  Inspection:  Grossly normal Breasts: Examined lying and sitting.   Right: Without masses, retractions, nipple discharge or axillary adenopathy.   Left: Without  masses, retractions, nipple discharge or axillary adenopathy. Genitourinary   Inguinal/mons:  Normal without inguinal adenopathy  External genitalia:  Normal appearing vulva with no masses, tenderness, or lesions  BUS/Urethra/Skene's glands:  Normal  Vagina:  Normal appearing with normal color and discharge, no lesions  Cervix:  Normal appearing without discharge or lesions  Uterus:  Normal in size, shape and contour.  Midline and mobile, nontender  Adnexa/parametria:     Rt: Normal in size, without masses or tenderness.   Lt: Normal in size, without masses or tenderness.  Anus and perineum: Normal  Digital rectal exam: Deferred  Patient informed chaperone available to be present for breast and pelvic exam. Patient has requested no chaperone to be present. Patient has been advised what will be completed during breast and pelvic exam.   Assessment/Plan:  52 y.o. [id number] for annual exam.   Well female exam with routine gynecological exam - Education provided on SBEs, importance of preventative screenings, current guidelines, high calcium diet, regular exercise, and multivitamin daily.  Labs with PCP.   Encounter for surveillance of contraceptive pills - Plan: norethindrone (ORTHO MICRONOR) 0.35 MG tablet daily. Discontinue COCs due to DVT in October. Will switch to POPs.  Screening examination for STD (sexually transmitted disease) - Plan: SURESWAB CT/NG/T. vaginalis, RPR, HIV Antibody (routine testing w rflx)  Screening for cervical cancer - Normal Pap history.  Will repeat at 5-year interval per guidelines.  Screening for breast cancer - Normal mammogram history.  Continue annual screenings.  Normal breast exam today.  Screening for colon cancer - 09/2021 colonoscopy. Will repeat at GI's recommended interval. Father deceased from colon cancer.  Return in 1 year for annual.  Olivia Mackie DNP, 10:09 AM 12/18/2022

## 2022-12-19 LAB — HIV ANTIBODY (ROUTINE TESTING W REFLEX): HIV 1&2 Ab, 4th Generation: NONREACTIVE

## 2022-12-19 LAB — SURESWAB CT/NG/T. VAGINALIS
C. trachomatis RNA, TMA: NOT DETECTED
N. gonorrhoeae RNA, TMA: NOT DETECTED
Trichomonas vaginalis RNA: NOT DETECTED

## 2022-12-19 LAB — RPR: RPR Ser Ql: NONREACTIVE

## 2023-03-10 ENCOUNTER — Inpatient Hospital Stay: Payer: BC Managed Care – PPO | Attending: Hematology and Oncology | Admitting: Hematology and Oncology

## 2023-03-10 ENCOUNTER — Other Ambulatory Visit: Payer: Self-pay

## 2023-03-10 ENCOUNTER — Inpatient Hospital Stay: Payer: BC Managed Care – PPO

## 2023-03-10 VITALS — BP 131/89 | HR 95 | Temp 97.7°F | Resp 18 | Ht 65.0 in | Wt 241.4 lb

## 2023-03-10 DIAGNOSIS — I82401 Acute embolism and thrombosis of unspecified deep veins of right lower extremity: Secondary | ICD-10-CM | POA: Insufficient documentation

## 2023-03-10 DIAGNOSIS — Z86718 Personal history of other venous thrombosis and embolism: Secondary | ICD-10-CM | POA: Insufficient documentation

## 2023-03-10 DIAGNOSIS — Z7901 Long term (current) use of anticoagulants: Secondary | ICD-10-CM | POA: Diagnosis not present

## 2023-03-10 DIAGNOSIS — I82411 Acute embolism and thrombosis of right femoral vein: Secondary | ICD-10-CM

## 2023-03-10 NOTE — Progress Notes (Signed)
[facility] CONSULT NOTE  Patient Care Team: Richmond Campbell., PA-C as PCP - General (Family Medicine) Olivia Mackie, NP as Nurse Practitioner (Gynecology)  CHIEF COMPLAINTS/PURPOSE OF CONSULTATION:  DVT and anticoagulation  HISTORY OF PRESENTING ILLNESS:  Sydney Glover 52 y.o. female is here because of history of DVT that was diagnosed in September 2023.  She presented with leg swelling and was identified to have femoral vein and popliteal vein DVTs.  She was on anticoagulation with Xarelto and she has been on it for the past 9 to 10 months.  She was referred to [place] for discussion regarding the cause of the blood clot as well as to decide if she can come off of blood thinners.  She has been doing quite well from the leg swelling standpoint.  No further pain or discomfort.  Her only issue with Xarelto is the cost.  I reviewed her records extensively and collaborated the history with the patient.   MEDICAL HISTORY:  Past Medical History:  Diagnosis Date   Anxiety    Depression    DVT (deep venous thrombosis) (HCC)    History of colonic polyps    BENIGN   Psoriasis    Tachycardia     SURGICAL HISTORY: Past Surgical History:  Procedure Laterality Date   ANTERIOR CRUCIATE LIGAMENT REPAIR  2011   CESAREAN SECTION  1995 & 2000   X2   COLPOSCOPY     TEE WITHOUT CARDIOVERSION N/A 09/19/2017   Procedure: TRANSESOPHAGEAL ECHOCARDIOGRAM (TEE);  Surgeon: Yates Decamp, MD;  Location: [facility] END[facility]COPY;  Service: Cardiovascular;  Laterality: N/A;   TUBAL LIGATION  2007   TUBAL REVERSAL  2008    SOCIAL HISTORY: Social History   Socioeconomic History   Marital status: Single    Spouse name: Not on file   Number of children: Not on file   Years of education: Not on file   Highest education level: Not on file  Occupational History   Not on file  Tobacco Use   Smoking status: Former    Current packs/day: 0.50    Average packs/day: 0.5 packs/day for 20.0 years  (10.0 ttl pk-yrs)    Types: Cigarettes   Smokeless tobacco: Current    Last attempt to quit: 11/12/2010   Tobacco comments:    Vape  Vaping Use   Vaping status: Never Used  Substance and Sexual Activity   Alcohol use: Yes    Comment: occ    Drug use: Not Currently   Sexual activity: Yes    Partners: Male    Birth control/protection: Surgical    Comment: BTL; First IC <16, >5 Partners  Other Topics Concern   Not on file  Social History Narrative   Not on file   Social Determinants of Health   Financial Resource Strain: Not on file  Food Insecurity: Not on file  Transportation Needs: Not on file  Physical Activity: Not on file  Stress: Not on file  Social Connections: Unknown (01/05/2022)   Received from [facility]   Social Network    Social Network: Not on file  Intimate Partner Violence: Unknown (11/28/2021)   Received from [facility]   HITS    Physically Hurt: Not on file    Insult or Talk Down To: Not on file    Threaten Physical Harm: Not on file    Scream or Curse: Not on file    FAMILY HISTORY: Family History  Problem Relation Age of Onset  Hypertension Mother    Cancer Father 67       COLON   Diabetes Brother    Heart attack Brother        1/2 brother-maternal    ALLERGIES:  is allergic to lisinopril.  MEDICATIONS:  Current Outpatient Medications  Medication Sig Dispense Refill   amphetamine-dextroamphetamine (ADDERALL) 20 MG tablet Take 20 mg by mouth daily.     ARIPiprazole (ABILIFY) 10 MG tablet Take 10 mg by mouth daily.     desonide (DESOWEN) 0.05 % cream Apply topically 2 (two) times daily. 60 g 0   norethindrone (ORTHO MICRONOR) 0.35 MG tablet Take 1 tablet (0.35 mg total) by mouth daily. 84 tablet 3   venlafaxine XR (EFFEXOR-XR) 37.5 MG 24 hr capsule Take by mouth.     venlafaxine XR (EFFEXOR-XR) 75 MG 24 hr capsule Take 1 capsule (75 mg total) by mouth daily with breakfast. For mood 30 capsule 0   XARELTO 20 MG TABS tablet TAKE 1 TABLET  BY MOUTH DAILY WITH DINNER. REASSESS IN JANUARY 2024 TO DETERMINE WHETHER PROLONGED OR LIFELONG ANTICOAGULATION IS INDICATED     No current facility-administered medications for this visit.    REVIEW OF SYSTEMS:   Constitutional: Denies fevers, chills or abnormal night sweats All other systems were reviewed with the patient and are negative.  PHYSICAL EXAMINATION: ECOG PERFORMANCE STATUS: 0 - Asymptomatic  Vitals:   03/10/23 1538  BP: 131/89  Pulse: 95  Resp: 18  Temp: 97.7 F (36.5 C)  SpO2: 96%   Filed Weights   03/10/23 1538  Weight: 241 lb 6.4 oz (109.5 kg)    GENERAL:alert, no distress and comfortable  LABORATORY DATA:  I have reviewed the data as listed Lab Results  Component Value Date   WBC 12.0 (H) 10/13/2018   HGB 13.1 10/13/2018   HCT 41.5 10/13/2018   MCV 97.0 10/13/2018   PLT 306 10/13/2018   Lab Results  Component Value Date   NA 139 10/13/2018   K 3.9 10/13/2018   CL 106 10/13/2018   CO2 25 10/13/2018    RADIOGRAPHIC STUDIES: I have personally reviewed the radiological reports and agreed with the findings in the report.  ASSESSMENT AND PLAN:  Right leg DVT (HCC) 05/24/2022: Right leg pain and edema: Ultrasound: Positive for DVT distal right femoral and right popliteal veins and right gastrocnemius veins Current treatment: Xarelto  I discussed with the patient risk factors for blood clots. Patient does not have any family history of blood clots. Inherited risk factors include: 1. Factor V Leiden mutation 2. Prothrombin gene G20210A 3. Protein S deficiency  4. Protein C deficiency  5. Antithrombin deficiency  Acquired risk factors include: 1. Antiphospholipid antibody syndrome 2. Tobacco use (none) 3. Obesity (I discussed with her that weight is a risk factor) 4. Medications including oral contraceptives (patient switched to progesterone containing contraception) 5. Sedentary behavior (patient is sedentary at work) 6. Foreign bodies in  circulation (none)  Workup recommended: Bloodwork to evaluate for the 5 inherited factors mentioned above along with antiphospholipid antibodies. Telephone visit in 1 weeks to discuss the results of the tests Because it can be false positives when someone is on anticoagulation I would like to stop Xarelto for 48 hours and then perform lab on 03/12/2023. If all the tests are negative and D-dimer is negative then she can discontinue Xarelto.  All questions were answered. The patient knows to call the clinic with any problems, questions or concerns.    Lenor Coffin  Pamelia Hoit, MD 03/10/23

## 2023-03-10 NOTE — Assessment & Plan Note (Signed)
05/24/2022: Right leg pain and edema: Ultrasound: Positive for DVT distal right femoral and right popliteal veins and right gastrocnemius veins Current treatment: Xarelto  I discussed with the patient risk factors for blood clots.  Inherited risk factors include: 1. Factor V Leiden mutation 2. Prothrombin gene G20210A 3. Protein S deficiency  4. Protein C deficiency  5. Antithrombin deficiency  Acquired risk factors include: 1. Antiphospholipid antibody syndrome 2. Tobacco use 3. Obesity 4. Medications including oral contraceptives 5. Sedentary behavior including postoperative state 6. Foreign bodies in circulation  Workup recommended: Bloodwork to evaluate for the 5 inherited factors mentioned above along with antiphospholipid antibodies. Telephone visit in 2 weeks to discuss the results of the tests

## 2023-03-12 ENCOUNTER — Other Ambulatory Visit: Payer: Self-pay

## 2023-03-12 ENCOUNTER — Inpatient Hospital Stay: Payer: BC Managed Care – PPO

## 2023-03-12 DIAGNOSIS — Z7901 Long term (current) use of anticoagulants: Secondary | ICD-10-CM | POA: Diagnosis not present

## 2023-03-12 DIAGNOSIS — I82411 Acute embolism and thrombosis of right femoral vein: Secondary | ICD-10-CM

## 2023-03-12 DIAGNOSIS — Z86718 Personal history of other venous thrombosis and embolism: Secondary | ICD-10-CM | POA: Diagnosis not present

## 2023-03-12 LAB — ANTITHROMBIN III: AntiThromb III Func: 111 % (ref 75–120)

## 2023-03-12 LAB — D-DIMER, QUANTITATIVE: D-Dimer, Quant: 0.49 ug/mL-FEU (ref 0.00–0.50)

## 2023-03-13 LAB — LUPUS ANTICOAGULANT PANEL
DRVVT: 45.2 s (ref 0.0–47.0)
PTT Lupus Anticoagulant: 36.8 s (ref 0.0–43.5)

## 2023-03-13 LAB — PROTEIN S, TOTAL: Protein S Ag, Total: 102 % (ref 60–150)

## 2023-03-14 LAB — CARDIOLIPIN ANTIBODIES, IGG, IGM, IGA
Anticardiolipin IgA: <9 APL U/mL (ref 0–11)
Anticardiolipin IgG: <9 GPL U/mL (ref 0–14)
Anticardiolipin IgM: <9 MPL U/mL (ref 0–12)

## 2023-03-14 LAB — BETA-2-GLYCOPROTEIN I ABS, IGG/M/A
Beta-2 Glyco I IgG: <9 GPI IgG units (ref 0–20)
Beta-2-Glycoprotein I IgA: <9 GPI IgA units (ref 0–25)
Beta-2-Glycoprotein I IgM: <9 GPI IgM units (ref 0–32)

## 2023-03-14 LAB — PROTEIN C, TOTAL: Protein C, Total: 100 % (ref 60–150)

## 2023-03-15 LAB — FACTOR 8 ASSAY: Coagulation Factor VIII: 157 % — ABNORMAL HIGH (ref 56–140)

## 2023-03-18 LAB — PROTHROMBIN GENE MUTATION

## 2023-03-19 LAB — FACTOR 5 LEIDEN

## 2023-03-23 NOTE — Progress Notes (Incomplete)
HEMATOLOGY-ONCOLOGY TELEPHONE VISIT PROGRESS NOTE  I connected with our patient on 03/27/23 at  1:45 PM EDT by telephone and verified that I am speaking with the correct person using two identifiers.  I discussed the limitations, risks, security and privacy concerns of performing an evaluation and management service by telephone and the availability of in person appointments.  I also discussed with the patient that there may be a patient responsible charge related to this service. The patient expressed understanding and agreed to proceed.   History of Present Illness: Sydney Glover 52 y.o. female is here because of history of DVT that was diagnosed in September 2023. She presents to the clinic for a telephone follow-up to discuss labs.  Oncology History   No history exists.    REVIEW OF SYSTEMS:   Constitutional: Denies fevers, chills or abnormal weight loss All other systems were reviewed with the patient and are negative. Observations/Objective:     Assessment Plan:  Right leg DVT (HCC) 05/24/2022: Right leg pain and edema: Ultrasound: Positive for DVT distal right femoral and right popliteal veins and right gastrocnemius veins Current treatment: Xarelto  Hypercoagulability panel 03/12/2023: Antithrombin 311%, no lupus anticoagulant detected, factor V Leiden: Not detected, prothrombin gene mutation: Not detected, protein C 100%, protein S 102%, factor VIII: 157%, anticardiolipin antibodies: Negative, beta-2 glycoprotein 1 antibodies: Negative, D-dimer 0.49  Duration of anticoagulation: I recommended that she can discontinue anticoagulation at this time.  However prior to discontinuing we will make sure with an ultrasound of the leg.    I discussed the assessment and treatment plan with the patient. The patient was provided an opportunity to ask questions and all were answered. The patient agreed with the plan and demonstrated an understanding of the instructions. The patient was  advised to call back or seek an in-person evaluation if the symptoms worsen or if the condition fails to improve as anticipated.   I provided 12 minutes of non-face-to-face time during this encounter.  This includes time for charting and coordination of care   Tamsen Meek, MD  I Janan Ridge am acting as a scribe for Dr.Vinay Gudena  I have reviewed the above documentation for accuracy and completeness, and I agree with the above.

## 2023-03-27 ENCOUNTER — Inpatient Hospital Stay: Payer: BC Managed Care – PPO | Attending: Hematology and Oncology | Admitting: Hematology and Oncology

## 2023-03-27 DIAGNOSIS — I82411 Acute embolism and thrombosis of right femoral vein: Secondary | ICD-10-CM

## 2023-03-27 DIAGNOSIS — Z86718 Personal history of other venous thrombosis and embolism: Secondary | ICD-10-CM | POA: Insufficient documentation

## 2023-03-27 NOTE — Assessment & Plan Note (Signed)
05/24/2022: Right leg pain and edema: Ultrasound: Positive for DVT distal right femoral and right popliteal veins and right gastrocnemius veins Current treatment: Xarelto  Hypercoagulability panel 03/12/2023: Antithrombin 311%, no lupus anticoagulant detected, factor V Leiden: Not detected, prothrombin gene mutation: Not detected, protein C 100%, protein S 102%, factor VIII: 157%, anticardiolipin antibodies: Negative, beta-2 glycoprotein 1 antibodies: Negative, D-dimer 0.49  Duration of anticoagulation: I recommended that she can discontinue anticoagulation at this time.  However prior to discontinuing we will make sure with an ultrasound of the leg.

## 2023-03-27 NOTE — Progress Notes (Signed)
I called the patient several times but I was unable to reach her.  Left a voicemail for her to call us back.  Hypercoagulability panel 03/12/2023: Antithrombin 311%, no lupus anticoagulant detected, factor V Leiden: Not detected, prothrombin gene mutation: Not detected, protein C 100%, protein S 102%, factor VIII: 157%, anticardiolipin antibodies: Negative, beta-2 glycoprotein 1 antibodies: Negative, D-dimer 0.49   Duration of anticoagulation: I would recommend her to discontinue anticoagulation at this time.  However prior to discontinuing we will make sure with an ultrasound of the leg.

## 2023-03-31 ENCOUNTER — Other Ambulatory Visit: Payer: Self-pay

## 2023-03-31 ENCOUNTER — Telehealth: Payer: Self-pay

## 2023-03-31 DIAGNOSIS — I82411 Acute embolism and thrombosis of right femoral vein: Secondary | ICD-10-CM

## 2023-03-31 NOTE — Telephone Encounter (Signed)
Pt called stating she had missed calls from MD 8/1 and was unavilable at that time d/t work.  Per MD, we discussed the pt's need for a D-Dimer and doppler to reassess DVT status. Per MD, if D-Dimer and DVT are negative, she can d/c xarelto. Pt understands this and is scheduled 8/12 for lab and doppler. She knows to expect a call from nursing for results per MD.

## 2023-04-07 ENCOUNTER — Other Ambulatory Visit: Payer: Self-pay

## 2023-04-07 ENCOUNTER — Other Ambulatory Visit: Payer: BC Managed Care – PPO

## 2023-04-07 ENCOUNTER — Inpatient Hospital Stay: Payer: BC Managed Care – PPO

## 2023-04-07 ENCOUNTER — Telehealth: Payer: Self-pay | Admitting: Hematology and Oncology

## 2023-04-07 ENCOUNTER — Ambulatory Visit (HOSPITAL_BASED_OUTPATIENT_CLINIC_OR_DEPARTMENT_OTHER)
Admission: RE | Admit: 2023-04-07 | Discharge: 2023-04-07 | Disposition: A | Discharge status: Home / Self Care | Payer: BC Managed Care – PPO | Source: Ambulatory Visit | Attending: Hematology and Oncology | Admitting: Hematology and Oncology

## 2023-04-07 DIAGNOSIS — I82411 Acute embolism and thrombosis of right femoral vein: Secondary | ICD-10-CM | POA: Diagnosis not present

## 2023-04-07 DIAGNOSIS — Z86718 Personal history of other venous thrombosis and embolism: Secondary | ICD-10-CM | POA: Diagnosis not present

## 2023-04-07 LAB — D-DIMER, QUANTITATIVE: D-Dimer, Quant: 0.39 ug/mL-FEU (ref 0.00–0.50)

## 2023-04-07 NOTE — Telephone Encounter (Signed)
I left a voicemail for the patient that the ultrasound of the leg and the D-dimer were both negative and therefore she can discontinue anticoagulation at this time.

## 2023-04-07 NOTE — Progress Notes (Signed)
Bilateral lower extremity venous duplex has been completed. Preliminary results can be found in CV Proc through chart review.  Results were given to Vernona Rieger at Dr. Earmon [place] office.  04/07/23 10:41 AM Olen Cordial RVT

## 2023-04-10 DIAGNOSIS — F900 Attention-deficit hyperactivity disorder, predominantly inattentive type: Secondary | ICD-10-CM | POA: Diagnosis not present

## 2023-04-10 DIAGNOSIS — E039 Hypothyroidism, unspecified: Secondary | ICD-10-CM | POA: Diagnosis not present

## 2023-04-10 DIAGNOSIS — E782 Mixed hyperlipidemia: Secondary | ICD-10-CM | POA: Diagnosis not present

## 2023-10-14 ENCOUNTER — Other Ambulatory Visit: Payer: Self-pay | Admitting: Nurse Practitioner

## 2023-10-14 DIAGNOSIS — Z3041 Encounter for surveillance of contraceptive pills: Secondary | ICD-10-CM

## 2023-10-14 NOTE — Telephone Encounter (Signed)
 Med refill request: norethindrone 0.35 mg Last AEX: 12/18/22 Next AEX: none scheduled Last MMG (if hormonal med) 05/30/17 BI-RADS 1 negative Last refill:  08/31/23  Refill authorized: norethindrone 0.35 mg #84, needs appointment.   Please approve or deny as appropriate.

## 2023-11-19 DIAGNOSIS — Z6841 Body Mass Index (BMI) 40.0 and over, adult: Secondary | ICD-10-CM | POA: Diagnosis not present

## 2023-11-19 DIAGNOSIS — E782 Mixed hyperlipidemia: Secondary | ICD-10-CM | POA: Diagnosis not present

## 2023-11-19 DIAGNOSIS — E039 Hypothyroidism, unspecified: Secondary | ICD-10-CM | POA: Diagnosis not present

## 2023-11-19 DIAGNOSIS — F319 Bipolar disorder, unspecified: Secondary | ICD-10-CM | POA: Diagnosis not present

## 2023-11-19 DIAGNOSIS — E66813 Obesity, class 3: Secondary | ICD-10-CM | POA: Diagnosis not present

## 2023-11-19 DIAGNOSIS — Z0000 Encounter for general adult medical examination without abnormal findings: Secondary | ICD-10-CM | POA: Diagnosis not present

## 2023-11-28 DIAGNOSIS — Z1231 Encounter for screening mammogram for malignant neoplasm of breast: Secondary | ICD-10-CM | POA: Diagnosis not present

## 2024-02-09 ENCOUNTER — Ambulatory Visit: Admitting: Nurse Practitioner

## 2024-02-10 ENCOUNTER — Other Ambulatory Visit: Payer: Self-pay | Admitting: Nurse Practitioner

## 2024-02-10 DIAGNOSIS — Z3041 Encounter for surveillance of contraceptive pills: Secondary | ICD-10-CM

## 2024-02-10 NOTE — Telephone Encounter (Signed)
 Med refill request: Micronor   Last AEX: 12/18/22  Next AEX: 03/04/24 Last MMG (if hormonal med) 11/28/23 BIRADS Cat 1 neg  Refill authorized: last rx 10/14/23 #84 with 0 refills. Please approve or deny

## 2024-03-03 NOTE — Progress Notes (Unsigned)
Sydney Glover 08-23-1971 [id number]   History:  53 y.o. [id number] presents for annual exam. On POPs for cycle regulation s/t perimenopause. Switched from COCs to POPs when she had DVT October 2023. No bleeding. No menopausal symptoms. Normal pap history. Anxiety, depression, HTN managed by PCP.   Gynecologic History No LMP recorded. (Menstrual status: Oral contraceptives).   Contraception/Family planning: oral progesterone-only contraceptive Sexually active: Yes  Health Maintenance Last Pap: 10/03/2021. Results were: Normal neg HPV Last mammogram: 11/28/2023. Results were: Normal Last colonoscopy: 10/19/2021, 5-year recall Last Dexa: Not indicated  Past medical history, past surgical history, family history and social history were all reviewed and documented in the EPIC chart. Boyfriend. 33 yo daughter, has 56 yo old son, 2 mo daughter. 63 yo daughter. 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 Father deceased from colon cancer at age 25.   ROS:  A ROS was performed and pertinent positives and negatives are included.  Exam:  Vitals:   03/04/24 0723  BP: 112/74  Pulse: 86  SpO2: 97%  Weight: 229 lb (103.9 kg)  Height: 5' 4.25 (1.632 m)     Body mass index is 39 kg/m.  General appearance:  Normal Thyroid :  Symmetrical, normal in size, without palpable masses or nodularity. Respiratory  Auscultation:  Clear without wheezing or rhonchi Cardiovascular  Auscultation:  Regular rate, without rubs, murmurs or gallops  Edema/varicosities:  Not grossly evident Abdominal  Soft,nontender, without masses, guarding or rebound.  Liver/spleen:  No organomegaly noted  Hernia:  None appreciated  Skin  Inspection:  Grossly normal Breasts: Examined lying and sitting.   Right: Without masses, retractions, nipple discharge or axillary adenopathy.   Left: Without masses, retractions, nipple discharge or axillary adenopathy. Pelvic: External genitalia:  no lesions              Urethra:   normal appearing urethra with no masses, tenderness or lesions              Bartholins and Skenes: normal                 Vagina: normal appearing vagina with normal color and discharge, no lesions              Cervix: no lesions Bimanual Exam:  Uterus:  no masses or tenderness              Adnexa: no mass, fullness, tenderness              Rectovaginal: Deferred              Anus:  normal, no lesions  Sydney Glover, CMA present as chaperone.   Assessment/Plan:  53 y.o. [id number] for annual exam.   Well female exam with routine gynecological exam - Education provided on SBEs, importance of preventative screenings, current guidelines, high calcium diet, regular exercise, and multivitamin daily.  Labs with PCP.   Encounter for surveillance of contraceptive pills - Plan: norethindrone  (ORTHO MICRONOR ) 0.35 MG tablet daily. Has had no bleeding for >1 year. Will discontinue. If bleeding returns will restart.   Screening for cervical cancer - Normal Pap history.  Will repeat at 5-year interval per guidelines.  Screening for breast cancer - Normal mammogram history.  Continue annual screenings.  Normal breast exam today.  Screening for colon cancer - 09/2021 colonoscopy. Will repeat at GI's recommended interval. Father deceased from colon cancer.  Return in about 1 year (around 03/04/2025) for Annual.    Sydney DELENA Shutter DNP, 8:01 AM  03/04/2024 

## 2024-03-04 ENCOUNTER — Ambulatory Visit (INDEPENDENT_AMBULATORY_CARE_PROVIDER_SITE_OTHER): Admitting: Nurse Practitioner

## 2024-03-04 ENCOUNTER — Encounter: Payer: Self-pay | Admitting: Nurse Practitioner

## 2024-03-04 VITALS — BP 112/74 | HR 86 | Ht 64.25 in | Wt 229.0 lb

## 2024-03-04 DIAGNOSIS — Z1331 Encounter for screening for depression: Secondary | ICD-10-CM | POA: Diagnosis not present

## 2024-03-04 DIAGNOSIS — Z01419 Encounter for gynecological examination (general) (routine) without abnormal findings: Secondary | ICD-10-CM

## 2024-03-04 DIAGNOSIS — Z3041 Encounter for surveillance of contraceptive pills: Secondary | ICD-10-CM

## 2024-03-04 MED ORDER — NORETHINDRONE 0.35 MG PO TABS: 1.0000 | ORAL_TABLET | Freq: Every day | ORAL | 3 refills | Status: AC

## 2024-05-05 ENCOUNTER — Other Ambulatory Visit: Payer: Self-pay | Admitting: Radiology

## 2024-05-05 DIAGNOSIS — Z3041 Encounter for surveillance of contraceptive pills: Secondary | ICD-10-CM

## 2024-05-05 NOTE — Telephone Encounter (Signed)
 Med refill request: norethindrone  0.35 mg received from [facility] LM for patient TCB (ok per DPR) to verify patient's pharmacy Last AEX: 03/04/24 Next AEX: none scheduled Last MMG (if hormonal med) 11/28/23 normal RX was sent to [facility] 03/04/24 QTY 84 with 3 refills. Refill authorized: Please Advise?

## 2024-05-21 DIAGNOSIS — E039 Hypothyroidism, unspecified: Secondary | ICD-10-CM | POA: Diagnosis not present

## 2024-05-21 DIAGNOSIS — F900 Attention-deficit hyperactivity disorder, predominantly inattentive type: Secondary | ICD-10-CM | POA: Diagnosis not present

## 2024-05-21 DIAGNOSIS — F319 Bipolar disorder, unspecified: Secondary | ICD-10-CM | POA: Diagnosis not present

## 2024-05-21 DIAGNOSIS — E782 Mixed hyperlipidemia: Secondary | ICD-10-CM | POA: Diagnosis not present

## 2024-08-16 NOTE — Addendum Note (Signed)
 Encounter addended by: Meliton Clarita SQUIBB, LCSW on: 08/16/2024 1:12 PM  Actions taken: Letter saved
